# Patient Record
Sex: Female | Born: 1950
Health system: Southern US, Community
[De-identification: ages and names within clinical notes are randomized; demographics above are authoritative.]

## PROBLEM LIST (undated history)

## (undated) DIAGNOSIS — N35919 Unspecified urethral stricture, male, unspecified site: Secondary | ICD-10-CM

## (undated) DIAGNOSIS — E785 Hyperlipidemia, unspecified: Secondary | ICD-10-CM

## (undated) DIAGNOSIS — N3941 Urge incontinence: Secondary | ICD-10-CM

## (undated) DIAGNOSIS — IMO0002 Reserved for concepts with insufficient information to code with codable children: Secondary | ICD-10-CM

## (undated) DIAGNOSIS — I10 Essential (primary) hypertension: Secondary | ICD-10-CM

## (undated) DIAGNOSIS — H409 Unspecified glaucoma: Secondary | ICD-10-CM

## (undated) DIAGNOSIS — J45909 Unspecified asthma, uncomplicated: Secondary | ICD-10-CM

## (undated) DIAGNOSIS — N390 Urinary tract infection, site not specified: Secondary | ICD-10-CM

## (undated) DIAGNOSIS — R339 Retention of urine, unspecified: Secondary | ICD-10-CM

## (undated) HISTORY — DX: Unspecified asthma, uncomplicated: J45.909

## (undated) HISTORY — PX: CATARACT EXTRACTION, BILATERAL: SHX1313

## (undated) HISTORY — PX: TONSILLECTOMY: SUR1361

## (undated) HISTORY — DX: Urge incontinence: N39.41

## (undated) HISTORY — PX: GANGLION CYST EXCISION: SHX1691

## (undated) HISTORY — DX: Hyperlipidemia, unspecified: E78.5

## (undated) HISTORY — DX: Urinary tract infection, site not specified: N39.0

## (undated) HISTORY — PX: KNEE SURGERY: SHX244

## (undated) HISTORY — DX: Reserved for concepts with insufficient information to code with codable children: IMO0002

## (undated) HISTORY — DX: Unspecified urethral stricture, male, unspecified site: N35.919

## (undated) HISTORY — DX: Unspecified glaucoma: H40.9

## (undated) HISTORY — PX: NASAL SINUS SURGERY: SHX719

## (undated) HISTORY — PX: ABDOMINAL HYSTERECTOMY: SUR658

## (undated) HISTORY — DX: Retention of urine, unspecified: R33.9

## (undated) HISTORY — DX: Essential (primary) hypertension: I10

---

## 2005-09-27 ENCOUNTER — Ambulatory Visit: Payer: Self-pay

## 2007-12-29 ENCOUNTER — Ambulatory Visit: Payer: Self-pay | Admitting: Urology

## 2011-04-27 DIAGNOSIS — M545 Low back pain: Secondary | ICD-10-CM | POA: Insufficient documentation

## 2011-04-27 DIAGNOSIS — R32 Unspecified urinary incontinence: Secondary | ICD-10-CM | POA: Insufficient documentation

## 2011-10-12 ENCOUNTER — Ambulatory Visit: Payer: Self-pay | Admitting: Otolaryngology

## 2011-10-27 ENCOUNTER — Ambulatory Visit: Payer: Self-pay | Admitting: Otolaryngology

## 2011-11-11 ENCOUNTER — Ambulatory Visit: Payer: Self-pay | Admitting: Otolaryngology

## 2012-01-26 DIAGNOSIS — I10 Essential (primary) hypertension: Secondary | ICD-10-CM | POA: Insufficient documentation

## 2012-02-01 DIAGNOSIS — R7303 Prediabetes: Secondary | ICD-10-CM | POA: Insufficient documentation

## 2012-02-10 DIAGNOSIS — M549 Dorsalgia, unspecified: Secondary | ICD-10-CM | POA: Insufficient documentation

## 2012-02-10 DIAGNOSIS — R079 Chest pain, unspecified: Secondary | ICD-10-CM | POA: Insufficient documentation

## 2012-04-18 DIAGNOSIS — R05 Cough: Secondary | ICD-10-CM | POA: Insufficient documentation

## 2012-04-18 DIAGNOSIS — J310 Chronic rhinitis: Secondary | ICD-10-CM | POA: Insufficient documentation

## 2012-08-10 DIAGNOSIS — J45991 Cough variant asthma: Secondary | ICD-10-CM | POA: Insufficient documentation

## 2014-09-11 DIAGNOSIS — E785 Hyperlipidemia, unspecified: Secondary | ICD-10-CM | POA: Insufficient documentation

## 2015-01-20 ENCOUNTER — Telehealth: Payer: Self-pay

## 2015-01-20 DIAGNOSIS — N35028 Other post-traumatic urethral stricture, female: Secondary | ICD-10-CM

## 2015-01-20 NOTE — Telephone Encounter (Signed)
Pt called requesting a refill on hyophen. Pt is a Radiation protection practitioner pt. Pt has a hx of urethral stricture from a bladder injury in her twenties. Pt gets dilated every 49mo. I dont see anywhere Carollee Herter noted in [employer] of rx hyophen. Please advise.

## 2015-01-20 NOTE — Telephone Encounter (Signed)
She can have 2 months supply and discuss further with Carollee Herter at her follow up next month.    Vanna Scotland, MD

## 2015-01-21 MED ORDER — METH-HYO-M BL-BENZ ACD-PH SAL 81.6 MG PO TABS: 81.6000 mg | ORAL_TABLET | Freq: Four times a day (QID) | ORAL | Status: DC

## 2015-01-21 NOTE — Telephone Encounter (Signed)
Spoke with pt in reference to medication refill. Reinforced with pt to keep f/u appt with [facility] for further refills. Pt voiced understanding.

## 2015-02-19 ENCOUNTER — Encounter: Payer: Self-pay | Admitting: *Deleted

## 2015-03-07 ENCOUNTER — Encounter: Payer: Self-pay | Admitting: Urology

## 2015-03-07 ENCOUNTER — Ambulatory Visit (INDEPENDENT_AMBULATORY_CARE_PROVIDER_SITE_OTHER): Payer: Medicare Other | Admitting: Urology

## 2015-03-07 VITALS — BP 116/70 | HR 85 | Ht 65.0 in | Wt 144.2 lb

## 2015-03-07 DIAGNOSIS — N359 Urethral stricture, unspecified: Secondary | ICD-10-CM | POA: Diagnosis not present

## 2015-03-07 LAB — MICROSCOPIC EXAMINATION: Renal Epithel, UA: NONE SEEN /hpf

## 2015-03-07 LAB — URINALYSIS, COMPLETE
Bilirubin, UA: NEGATIVE
GLUCOSE, UA: NEGATIVE
KETONES UA: NEGATIVE
Leukocytes, UA: NEGATIVE
NITRITE UA: NEGATIVE
PROTEIN UA: NEGATIVE
RBC, UA: NEGATIVE
SPEC GRAV UA: 1.02 (ref 1.005–1.030)
UUROB: 0.2 mg/dL (ref 0.2–1.0)
pH, UA: 7 (ref 5.0–7.5)

## 2015-03-07 MED ORDER — LIDOCAINE HCL 2 % EX GEL
1.0000 "application " | Freq: Once | CUTANEOUS | Status: AC
  Administered 2015-03-07: 1 via URETHRAL

## 2015-03-07 NOTE — Progress Notes (Signed)
03/07/2015 8:06 AM   Brenda Hayden Jul 18, 1950 [id number]  Referring provider: No referring provider defined for this encounter.  Chief Complaint  Patient presents with  . Urethral stricture    Dilation    HPI: Patient is a 64 year old white female who presents today for urethral dilation.  Patient takes a daily prophylactic Macrobid, but she had stopped the prophylactic antibiotic due to her typhoid vaccination for an upcoming cruise.  She states 3-4 days after the discontinuation of the prophylactic aggravated, she started experiencing dysuria and difficulty with emptying her bladder. She then restarted the prophylactic daily Macrobid and her symptoms are starting to improve somewhat.  Today, she denies any dysuria, gross hematuria suprapubic pain. She also denies any fevers, chills, nausea or vomiting.   PNH: Past Medical History  Diagnosis Date  . HTN (hypertension)   . Glaucoma   . Urgency incontinence   . HLD (hyperlipidemia)   . Asthma   . Urethral stricture   . Incomplete bladder emptying   . Recurrent UTI   . Urethral stenosis     Surgical History: Past Surgical History  Procedure Laterality Date  . Knee surgery    . Nasal sinus surgery    . Abdominal hysterectomy      Home Medications:    Medication List       This list is accurate as of: 03/07/15 11:59 PM.  Always use your most recent med list.               albuterol 108 (90 BASE) MCG/ACT inhaler  Commonly known as:  PROVENTIL HFA;VENTOLIN HFA  Inhale 2 puffs into the lungs every 6 (six) hours as needed for wheezing or shortness of breath.     aspirin 81 MG tablet  Take 81 mg by mouth daily.     beclomethasone 40 MCG/ACT inhaler  Commonly known as:  QVAR  Inhale into the lungs 2 (two) times daily.     carvedilol 10 MG 24 hr capsule  Commonly known as:  COREG CR  Take 10 mg by mouth daily.     cyclobenzaprine 10 MG tablet  Commonly known as:  FLEXERIL  Take 10 mg by mouth 3 (three)  times daily as needed for muscle spasms.     hydrochlorothiazide 12.5 MG tablet  Commonly known as:  HYDRODIURIL  Take by mouth.     Meth-Hyo-M Bl-Benz Acd-Ph Sal 81.6 MG Tabs  Commonly known as:  HYOPHEN  Take 1 tablet (81.6 mg total) by mouth QID.     nitrofurantoin 100 MG capsule  Commonly known as:  MACRODANTIN  Take 100 mg by mouth daily.     pravastatin 40 MG tablet  Commonly known as:  PRAVACHOL  Take 40 mg by mouth daily.     SALMON OIL PO  Take by mouth.     traMADol 50 MG tablet  Commonly known as:  ULTRAM  Take by mouth every 6 (six) hours as needed.     Travoprost (BAK Free) 0.004 % Soln ophthalmic solution  Commonly known as:  TRAVATAN  1 drop at bedtime.     trimethoprim 100 MG tablet  Commonly known as:  TRIMPEX  Take 100 mg by mouth 2 (two) times daily.     vitamin E 1000 UNIT capsule  Take by mouth.     VITAMIN E BEAUTY 40981 UNIT/52ML Oil  Apply topically.        Allergies:  Allergies  Allergen Reactions  . Nubain [  Nalbuphine Hcl]   . Amlodipine Other (See Comments)    "Muscle pain and weakness"  . Atorvastatin Other (See Comments)    Felt bad, loss of appetite  . Ciprofloxacin-Ciproflox Hcl Er   . Latex   . Penicillins Nausea And Vomiting  . Singulair  [Montelukast Sodium] Other (See Comments)    Family History: Family History  Problem Relation Age of Onset  . Kidney disease Neg Hx   . Prostate cancer Neg Hx   . Bladder Cancer Neg Hx     Social History:  reports that she has never smoked. She does not have any smokeless tobacco history on file. She reports that she does not drink alcohol or use illicit drugs.  ROS: UROLOGY Frequent Urination?: Yes Hard to postpone urination?: No Burning/pain with urination?: No Get up at night to urinate?: Yes Leakage of urine?: Yes Urine stream starts and stops?: No Trouble starting stream?: No Do you have to strain to urinate?: No Blood in urine?: No Urinary tract infection?:  No Sexually transmitted disease?: No Injury to kidneys or bladder?: No Painful intercourse?: No Weak stream?: No Currently pregnant?: No Vaginal bleeding?: No Last menstrual period?: n  Gastrointestinal Nausea?: No Vomiting?: No Indigestion/heartburn?: No Diarrhea?: No Constipation?: No  Constitutional Fever: No Night sweats?: No Weight loss?: No Fatigue?: No  Skin Skin rash/lesions?: No Itching?: No  Eyes Blurred vision?: No Double vision?: No  Ears/Nose/Throat Sore throat?: No Sinus problems?: Yes  Hematologic/Lymphatic Swollen glands?: No Easy bruising?: No  Cardiovascular Leg swelling?: No Chest pain?: No  Respiratory Cough?: Yes Shortness of breath?: Yes  Endocrine Excessive thirst?: No  Musculoskeletal Back pain?: Yes Joint pain?: Yes  Neurological Headaches?: Yes Dizziness?: No  Psychologic Depression?: No Anxiety?: No  Physical Exam: BP 116/70 mmHg  Pulse 85  Ht  (1.651 m)  Wt 144 lb 3.2 oz (65.409 kg)  BMI 24.00 kg/m2  GU: Normal external genitalia.  Normal urethral meatus. No urethral masses and/or tenderness. No bladder fullness or masses. No vaginal lesions or discharge. Normal rectal tone, no masses. Normal anus and perineum.   Laboratory Data:  Urinalysis: Results for orders placed or performed in visit on 03/07/15  Microscopic Examination  Result Value Ref Range   WBC, UA 0-5 0 -  5 /hpf   RBC, UA 0-2 0 -  2 /hpf   Epithelial Cells (non renal) 0-10 0 - 10 /hpf   Renal Epithel, UA None seen None seen /hpf   Casts Present (A) None seen /lpf   Cast Type Hyaline casts N/A   Mucus, UA Present (A) Not Estab.   Bacteria, UA Few (A) None seen/Few  Urinalysis, Complete  Result Value Ref Range   Specific Gravity, UA 1.020 1.005 - 1.030   pH, UA 7.0 5.0 - 7.5   Color, UA Green (A) Yellow   Appearance Ur Clear Clear   Leukocytes, UA Negative Negative   Protein, UA Negative Negative/Trace   Glucose, UA Negative Negative    Ketones, UA Negative Negative   RBC, UA Negative Negative   Bilirubin, UA Negative Negative   Urobilinogen, Ur 0.2 0.2 - 1.0 mg/dL   Nitrite, UA Negative Negative   Microscopic Examination See below:    Procedure: Patient is placed in stirrups and her urethral meatus and vulva are cleansed with Betadine.  2% Lidocaine jelly was inserted into her urethra.  I then dilated her with Leta Jungling sounds to a 62f Walther sound without difficultly.  She tolerated the procedure well.  She will return in 4 months.   Assessment & Plan:    1. Urethral stricture, unspecified location, unspecified stricture type:   Patient tolerated the dilation.  She will continue her daily Macrobid and RTC in 4 months for her next dilation.    - Urinalysis, Complete   Return in about 4 months (around 07/07/2015) for dilation.  Michiel Cowboy, PA-C  [facility] [address] [place], [place] [address] [phone]

## 2015-03-10 DIAGNOSIS — N35919 Unspecified urethral stricture, male, unspecified site: Secondary | ICD-10-CM | POA: Insufficient documentation

## 2015-05-30 ENCOUNTER — Encounter: Payer: Self-pay | Admitting: Urology

## 2015-05-30 ENCOUNTER — Ambulatory Visit (INDEPENDENT_AMBULATORY_CARE_PROVIDER_SITE_OTHER): Payer: Medicare Other | Admitting: Urology

## 2015-05-30 VITALS — BP 118/76 | HR 78 | Ht 64.0 in | Wt 145.4 lb

## 2015-05-30 DIAGNOSIS — N359 Urethral stricture, unspecified: Secondary | ICD-10-CM

## 2015-05-30 LAB — URINALYSIS, COMPLETE
Bilirubin, UA: NEGATIVE
GLUCOSE, UA: NEGATIVE
KETONES UA: NEGATIVE
Leukocytes, UA: NEGATIVE
NITRITE UA: NEGATIVE
Protein, UA: NEGATIVE
RBC, UA: NEGATIVE
SPEC GRAV UA: 1.025 (ref 1.005–1.030)
UUROB: 0.2 mg/dL (ref 0.2–1.0)
pH, UA: 6.5 (ref 5.0–7.5)

## 2015-05-30 LAB — MICROSCOPIC EXAMINATION

## 2015-05-30 MED ORDER — NITROFURANTOIN MACROCRYSTAL 100 MG PO CAPS: 100.0000 mg | ORAL_CAPSULE | Freq: Every day | ORAL | Status: DC

## 2015-05-30 NOTE — Progress Notes (Signed)
10:47 AM   Brenda Hayden 1950/06/24 [id number]  Referring provider: Elby Beck, NP [address] [place], [place] [address]  Chief Complaint  Patient presents with  . Urethral Stricture    Dilation    HPI: Patient is a 64 year old white female who presents today for urethral dilation.  Patient was changed to trimethoprim as a daily prophylactic antibiotic at her last visit.   She had Macrobid on hand, so she took 2-3 days of that antibiotic when she felt a worsening of her symptoms. Her symptoms consist of dysuria and difficulty emptying her bladder.   She denies any recent dysuria, gross hematuria suprapubic pain. She also denies any fevers, chills, nausea or vomiting.   PNH: Past Medical History  Diagnosis Date  . HTN (hypertension)   . Glaucoma   . Urgency incontinence   . HLD (hyperlipidemia)   . Asthma   . Urethral stricture   . Incomplete bladder emptying   . Recurrent UTI   . Urethral stenosis     Surgical History: Past Surgical History  Procedure Laterality Date  . Knee surgery    . Nasal sinus surgery    . Abdominal hysterectomy      Home Medications:    Medication List       This list is accurate as of: 05/30/15 10:47 AM.  Always use your most recent med list.               albuterol 108 (90 BASE) MCG/ACT inhaler  Commonly known as:  PROVENTIL HFA;VENTOLIN HFA  Inhale 2 puffs into the lungs every 6 (six) hours as needed for wheezing or shortness of breath.     aspirin 81 MG tablet  Take 81 mg by mouth daily.     beclomethasone 40 MCG/ACT inhaler  Commonly known as:  QVAR  Inhale into the lungs 2 (two) times daily.     carvedilol 10 MG 24 hr capsule  Commonly known as:  COREG CR  Take 10 mg by mouth daily.     cyclobenzaprine 10 MG tablet  Commonly known as:  FLEXERIL  Take 10 mg by mouth 3 (three) times daily as needed for muscle spasms. Reported on 05/30/2015     hydrochlorothiazide 12.5 MG tablet    Commonly known as:  HYDRODIURIL  Take by mouth.     Meth-Hyo-M Bl-Benz Acd-Ph Sal 81.6 MG Tabs  Commonly known as:  HYOPHEN  Take 1 tablet (81.6 mg total) by mouth QID.     nitrofurantoin 100 MG capsule  Commonly known as:  MACRODANTIN  Take 1 capsule (100 mg total) by mouth daily.     pravastatin 40 MG tablet  Commonly known as:  PRAVACHOL  Take 40 mg by mouth daily.     SALMON OIL PO  Take by mouth. Reported on 05/30/2015     traMADol 50 MG tablet  Commonly known as:  ULTRAM  Take by mouth every 6 (six) hours as needed. Reported on 05/30/2015     Travoprost (BAK Free) 0.004 % Soln ophthalmic solution  Commonly known as:  TRAVATAN  1 drop at bedtime.     trimethoprim 100 MG tablet  Commonly known as:  TRIMPEX  Take 100 mg by mouth 2 (two) times daily.     vitamin E 1000 UNIT capsule  Take by mouth.     VITAMIN E BEAUTY 84696 UNIT/52ML Oil  Apply topically. Reported on 05/30/2015  Allergies:  Allergies  Allergen Reactions  . Nubain [Nalbuphine Hcl]   . Amlodipine Other (See Comments)    "Muscle pain and weakness"  . Atorvastatin Other (See Comments)    Felt bad, loss of appetite  . Ciprofloxacin-Ciproflox Hcl Er   . Latex   . Penicillins Nausea And Vomiting  . Singulair  [Montelukast Sodium] Other (See Comments)    Family History: Family History  Problem Relation Age of Onset  . Kidney disease Neg Hx   . Prostate cancer Neg Hx   . Bladder Cancer Neg Hx     Social History:  reports that she has never smoked. She does not have any smokeless tobacco history on file. She reports that she does not drink alcohol or use illicit drugs.  ROS: UROLOGY Frequent Urination?: Yes Hard to postpone urination?: No Burning/pain with urination?: Yes Get up at night to urinate?: Yes Leakage of urine?: Yes Urine stream starts and stops?: No Trouble starting stream?: No Do you have to strain to urinate?: No Blood in urine?: No Urinary tract infection?:  No Sexually transmitted disease?: No Injury to kidneys or bladder?: No Painful intercourse?: No Weak stream?: No Currently pregnant?: No Vaginal bleeding?: No Last menstrual period?: n  Gastrointestinal Nausea?: No Vomiting?: No Indigestion/heartburn?: No Diarrhea?: No Constipation?: No  Constitutional Fever: No Night sweats?: No Weight loss?: No Fatigue?: No  Skin Skin rash/lesions?: No Itching?: No  Eyes Blurred vision?: No Double vision?: No  Ears/Nose/Throat Sore throat?: Yes Sinus problems?: Yes  Hematologic/Lymphatic Swollen glands?: No Easy bruising?: No  Cardiovascular Leg swelling?: No Chest pain?: No  Respiratory Cough?: Yes Shortness of breath?: Yes  Endocrine Excessive thirst?: No  Musculoskeletal Back pain?: Yes Joint pain?: No  Neurological Headaches?: No Dizziness?: No  Psychologic Depression?: No Anxiety?: No  Physical Exam: BP 118/76 mmHg  Pulse 78  Ht  (1.626 m)  Wt 145 lb 6.4 oz (65.953 kg)  BMI 24.95 kg/m2  GU: Normal external genitalia.  Normal urethral meatus. No urethral masses and/or tenderness. No bladder fullness or masses. No vaginal lesions or discharge. Normal rectal tone, no masses. Normal anus and perineum.   Laboratory Data: Urinalysis: Results for orders placed or performed in visit on 05/30/15  Microscopic Examination  Result Value Ref Range   WBC, UA 0-5 0 -  5 /hpf   RBC, UA 0-2 0 -  2 /hpf   Epithelial Cells (non renal) 0-10 0 - 10 /hpf   Mucus, UA Present (A) Not Estab.   Bacteria, UA Few (A) None seen/Few  Urinalysis, Complete  Result Value Ref Range   Specific Gravity, UA 1.025 1.005 - 1.030   pH, UA 6.5 5.0 - 7.5   Color, UA Green (A) Yellow   Appearance Ur Clear Clear   Leukocytes, UA Negative Negative   Protein, UA Negative Negative/Trace   Glucose, UA Negative Negative   Ketones, UA Negative Negative   RBC, UA Negative Negative   Bilirubin, UA Negative Negative   Urobilinogen,  Ur 0.2 0.2 - 1.0 mg/dL   Nitrite, UA Negative Negative   Microscopic Examination See below:    Procedure: Patient is placed in lithotomy position and her urethral meatus and vulva are cleansed with Betadine.  2% Lidocaine jelly was inserted into her urethra.  I then dilated her with Leta Jungling sounds to a 70f Walther sound without difficultly.  She tolerated the procedure well.  She will return in 3 months.   Assessment & Plan:    1. Urethral  stricture, unspecified location, unspecified stricture type:   Patient tolerated the dilation.  She will continue her daily trimethoprim and RTC in 4 months for her next dilation.  Patient is warned about peripheral neuropathy and pulmonary issues associated with long-term use of Macrobid.  She states she understands these risks, but it is the only antibiotic that seems to control her symptoms.  I have refilled her trimethoprim prescription and have given her 30 capsules of the Macrobid to use sparingly.  She will return in 3 months for her next dilation.  - Urinalysis, Complete   Return in about 3 months (around 08/28/2015) for urethral dilation.  Michiel CowboySHANNON Demonica Farrey, PA-C  [facility] [address], [address] [place], [place] [id number] [phone]

## 2015-07-04 ENCOUNTER — Ambulatory Visit: Payer: Medicare Other | Admitting: Urology

## 2015-07-28 DIAGNOSIS — E119 Type 2 diabetes mellitus without complications: Secondary | ICD-10-CM | POA: Diagnosis not present

## 2015-07-28 DIAGNOSIS — R002 Palpitations: Secondary | ICD-10-CM | POA: Diagnosis not present

## 2015-07-28 DIAGNOSIS — R079 Chest pain, unspecified: Secondary | ICD-10-CM | POA: Diagnosis not present

## 2015-08-08 DIAGNOSIS — R079 Chest pain, unspecified: Secondary | ICD-10-CM | POA: Diagnosis not present

## 2015-08-08 DIAGNOSIS — I10 Essential (primary) hypertension: Secondary | ICD-10-CM | POA: Diagnosis not present

## 2015-08-08 DIAGNOSIS — Z7689 Persons encountering health services in other specified circumstances: Secondary | ICD-10-CM | POA: Diagnosis not present

## 2015-08-29 ENCOUNTER — Ambulatory Visit: Payer: Medicare Other | Admitting: Urology

## 2015-08-29 DIAGNOSIS — J310 Chronic rhinitis: Secondary | ICD-10-CM | POA: Diagnosis not present

## 2015-08-29 DIAGNOSIS — J45991 Cough variant asthma: Secondary | ICD-10-CM | POA: Diagnosis not present

## 2015-08-29 DIAGNOSIS — J45909 Unspecified asthma, uncomplicated: Secondary | ICD-10-CM | POA: Diagnosis not present

## 2015-09-03 DIAGNOSIS — E119 Type 2 diabetes mellitus without complications: Secondary | ICD-10-CM | POA: Insufficient documentation

## 2015-09-04 DIAGNOSIS — I10 Essential (primary) hypertension: Secondary | ICD-10-CM | POA: Diagnosis not present

## 2015-09-04 DIAGNOSIS — J45991 Cough variant asthma: Secondary | ICD-10-CM | POA: Diagnosis not present

## 2015-09-04 DIAGNOSIS — Z1239 Encounter for other screening for malignant neoplasm of breast: Secondary | ICD-10-CM | POA: Diagnosis not present

## 2015-09-04 DIAGNOSIS — E119 Type 2 diabetes mellitus without complications: Secondary | ICD-10-CM | POA: Diagnosis not present

## 2015-09-04 DIAGNOSIS — Z0000 Encounter for general adult medical examination without abnormal findings: Secondary | ICD-10-CM | POA: Diagnosis not present

## 2015-09-04 DIAGNOSIS — Z13820 Encounter for screening for osteoporosis: Secondary | ICD-10-CM | POA: Diagnosis not present

## 2015-09-04 DIAGNOSIS — Z23 Encounter for immunization: Secondary | ICD-10-CM | POA: Diagnosis not present

## 2015-09-17 DIAGNOSIS — Z961 Presence of intraocular lens: Secondary | ICD-10-CM | POA: Diagnosis not present

## 2015-09-17 DIAGNOSIS — H43812 Vitreous degeneration, left eye: Secondary | ICD-10-CM | POA: Diagnosis not present

## 2015-09-17 DIAGNOSIS — H401131 Primary open-angle glaucoma, bilateral, mild stage: Secondary | ICD-10-CM | POA: Diagnosis not present

## 2015-10-30 ENCOUNTER — Other Ambulatory Visit: Payer: Self-pay | Admitting: Urology

## 2015-12-23 DIAGNOSIS — Z1231 Encounter for screening mammogram for malignant neoplasm of breast: Secondary | ICD-10-CM | POA: Diagnosis not present

## 2015-12-23 DIAGNOSIS — N959 Unspecified menopausal and perimenopausal disorder: Secondary | ICD-10-CM | POA: Diagnosis not present

## 2015-12-23 DIAGNOSIS — Z13820 Encounter for screening for osteoporosis: Secondary | ICD-10-CM | POA: Diagnosis not present

## 2016-01-09 ENCOUNTER — Ambulatory Visit (INDEPENDENT_AMBULATORY_CARE_PROVIDER_SITE_OTHER): Payer: Medicare Other | Admitting: Urology

## 2016-01-09 ENCOUNTER — Encounter: Payer: Self-pay | Admitting: Urology

## 2016-01-09 VITALS — BP 139/75 | HR 81 | Ht 64.0 in | Wt 143.2 lb

## 2016-01-09 DIAGNOSIS — N35028 Other post-traumatic urethral stricture, female: Secondary | ICD-10-CM | POA: Diagnosis not present

## 2016-01-09 DIAGNOSIS — R310 Gross hematuria: Secondary | ICD-10-CM | POA: Diagnosis not present

## 2016-01-09 DIAGNOSIS — N359 Urethral stricture, unspecified: Secondary | ICD-10-CM

## 2016-01-09 LAB — URINALYSIS, COMPLETE
Bilirubin, UA: NEGATIVE
GLUCOSE, UA: NEGATIVE
Ketones, UA: NEGATIVE
LEUKOCYTES UA: NEGATIVE
NITRITE UA: NEGATIVE
PROTEIN UA: NEGATIVE
RBC, UA: NEGATIVE
Specific Gravity, UA: 1.02 (ref 1.005–1.030)
Urobilinogen, Ur: 0.2 mg/dL (ref 0.2–1.0)
pH, UA: 7 (ref 5.0–7.5)

## 2016-01-09 LAB — MICROSCOPIC EXAMINATION: Bacteria, UA: NONE SEEN

## 2016-01-09 MED ORDER — METH-HYO-M BL-BENZ ACD-PH SAL 81.6 MG PO TABS: 81.6000 mg | ORAL_TABLET | Freq: Four times a day (QID) | ORAL | 4 refills | Status: DC

## 2016-01-09 MED ORDER — TRIMETHOPRIM 100 MG PO TABS: 100.0000 mg | ORAL_TABLET | Freq: Two times a day (BID) | ORAL | 4 refills | Status: DC

## 2016-01-09 NOTE — Progress Notes (Signed)
9:26 AM   Brenda Hayden Sep 08, 1950 [id number]  Referring provider: Elby Beck, NP [address] [place], [place] [address]  Chief Complaint  Patient presents with  . Follow-up    urethral Dilation    HPI: Patient is a 65 year old Caucasian female who presents today for urethral dilation.    Her complaints today consist of frequency and incontinence.  It has been 6 months since her last dilation.  She did have an episode of gross hematuria.     She does not have a prior history of recurrent urinary tract infections, nephrolithiasis, trauma to the genitourinary tract or malignancies of the genitourinary tract.   She does not have a family medical history of nephrolithiasis, malignancies of the genitourinary tract or hematuria.   Today, she is  having symptoms of frequent urination and incontinence. She is not having symptoms of urgency, dysuria, nocturia, hesitancy, intermittency, straining to urinate or a weak urinary stream.  Her UA today demonstrates no hematuria.  She not experiencing any suprapubic pain, abdominal pain or flank pain. She denies any recent fevers, chills, nausea or vomiting.   She is not a smoker.   She is not exposed to secondhand smoke.  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    PNH: Past Medical History:  Diagnosis Date  . Asthma   . Glaucoma   . HLD (hyperlipidemia)   . HTN (hypertension)   . Incomplete bladder emptying   . Recurrent UTI   . Urethral stenosis   . Urethral stricture   . Urgency incontinence     Surgical History: Past Surgical History:  Procedure Laterality Date  . ABDOMINAL HYSTERECTOMY    . GANGLION CYST EXCISION    . KNEE SURGERY    . NASAL SINUS SURGERY    . TONSILLECTOMY      Home Medications:    Medication List       Accurate as of 01/09/16  9:26 AM. Always use your most recent med list.          albuterol 108 (90 Base) MCG/ACT inhaler Commonly known as:  PROVENTIL  HFA;VENTOLIN HFA Inhale 2 puffs into the lungs every 6 (six) hours as needed for wheezing or shortness of breath.   aspirin 81 MG tablet Take 81 mg by mouth daily.   beclomethasone 40 MCG/ACT inhaler Commonly known as:  QVAR Inhale into the lungs 2 (two) times daily.   carvedilol 10 MG 24 hr capsule Commonly known as:  COREG CR Take 10 mg by mouth daily.   cyclobenzaprine 10 MG tablet Commonly known as:  FLEXERIL Take 10 mg by mouth 3 (three) times daily as needed for muscle spasms. Reported on 05/30/2015   hydrochlorothiazide 12.5 MG tablet Commonly known as:  HYDRODIURIL Take by mouth.   hydrochlorothiazide 12.5 MG tablet Commonly known as:  HYDRODIURIL Take by mouth.   Meth-Hyo-M Bl-Benz Acd-Ph Sal 81.6 MG Tabs Commonly known as:  HYOPHEN Take 1 tablet (81.6 mg total) by mouth QID.   mometasone 50 MCG/ACT nasal spray Commonly known as:  NASONEX   nitrofurantoin 100 MG capsule Commonly known as:  MACRODANTIN Take 1 capsule (100 mg total) by mouth daily.   pravastatin 40 MG tablet Commonly known as:  PRAVACHOL Take 40 mg by mouth daily.   SALMON OIL PO Take by mouth. Reported on 05/30/2015   SALMON OIL-1000 PO Take by mouth.   traMADol 50 MG tablet Commonly known as:  ULTRAM Take by  mouth every 6 (six) hours as needed. Reported on 05/30/2015   travoprost (benzalkonium) 0.004 % ophthalmic solution Commonly known as:  TRAVATAN Apply to eye.   trimethoprim 100 MG tablet Commonly known as:  TRIMPEX Take 1 tablet (100 mg total) by mouth 2 (two) times daily.   vitamin E 1000 UNIT capsule Take by mouth.   VITAMIN E BEAUTY 16109 UNIT/52ML Oil Apply topically. Reported on 05/30/2015       Allergies:  Allergies  Allergen Reactions  . Nubain [Nalbuphine Hcl]   . Amlodipine Other (See Comments)    "Muscle pain and weakness"  . Atorvastatin Other (See Comments)    Felt bad, loss of appetite  . Ciprofloxacin-Ciproflox Hcl Er   . Latex   . Nalbuphine  Other (See Comments)  . Penicillins Nausea And Vomiting  . Singulair  [Montelukast Sodium] Other (See Comments)    Family History: Family History  Problem Relation Age of Onset  . Kidney disease Neg Hx   . Prostate cancer Neg Hx   . Bladder Cancer Neg Hx     Social History:  reports that she has never smoked. She has never used smokeless tobacco. She reports that she does not drink alcohol or use drugs.  ROS: UROLOGY Frequent Urination?: Yes Hard to postpone urination?: No Burning/pain with urination?: No Get up at night to urinate?: No Leakage of urine?: Yes Urine stream starts and stops?: No Trouble starting stream?: No Do you have to strain to urinate?: No Blood in urine?: No Urinary tract infection?: No Sexually transmitted disease?: No Injury to kidneys or bladder?: No Painful intercourse?: No Weak stream?: No Currently pregnant?: No Vaginal bleeding?: Yes Last menstrual period?: n  Gastrointestinal Nausea?: No Vomiting?: No Indigestion/heartburn?: No Diarrhea?: No Constipation?: No  Constitutional Fever: No Night sweats?: Yes Weight loss?: No Fatigue?: No  Skin Skin rash/lesions?: No Itching?: No  Eyes Blurred vision?: No Double vision?: No  Ears/Nose/Throat Sore throat?: No Sinus problems?: Yes  Hematologic/Lymphatic Swollen glands?: No Easy bruising?: Yes  Cardiovascular Leg swelling?: No Chest pain?: No  Respiratory Cough?: Yes Shortness of breath?: Yes  Endocrine Excessive thirst?: No  Musculoskeletal Back pain?: Yes Joint pain?: No  Neurological Headaches?: No Dizziness?: No  Psychologic Depression?: No Anxiety?: No  Physical Exam: BP 139/75   Pulse 81   Ht 5\' 4"  (1.626 m)   Wt 143 lb 3.2 oz (65 kg)   BMI 24.58 kg/m   GU: Atrophic external genitalia.  Normal urethral meatus. No urethral masses and/or tenderness. No bladder fullness or masses. No vaginal lesions or discharge. Normal rectal tone, no masses. Normal  anus and perineum.   Laboratory Data: Urinalysis: Not significant for hematuria.  See EPIC.  Procedure: Patient is placed in lithotomy position and her urethral meatus and vulva are cleansed with Betadine.  2% Lidocaine jelly was inserted into her urethra.  I then dilated her with Leta Jungling sounds to a 34f Walther sound without difficultly.  She tolerated the procedure well.     Assessment & Plan:    1. Urethral stricture:   Patient tolerated the dilation.  She will continue her daily trimethoprim and Hyophen prn.     2. Gross hematuria:   I explained to the patient that there are a number of causes that can be associated with blood in the urine, such as stones,  UTI's, damage to the urinary tract and/or cancer.  At this time, I felt that the patient warranted further urologic evaluation.   The AUA guidelines  state that a CT urogram is the preferred imaging study to evaluate hematuria.  I explained to the patient that a contrast material will be injected into a vein and that in rare instances, an allergic reaction can result and may even life threatening   The patient denies any allergies to contrast, iodine and/or seafood and is not taking metformin.  Her reproductive status is status post hysterectomy.    Following the imaging study,  I've recommended a cystoscopy. I described how this is performed, typically in an office setting with a flexible cystoscope. We described the risks, benefits, and possible side effects, the most common of which is a minor amount of blood in the urine and/or burning which usually resolves in 24 to 48 hours.    The patient had the opportunity to ask questions which were answered. Based upon this discussion, the patient is willing to proceed. Therefore, I've ordered: a CT Urogram and cystoscopy.  She will return following all of the above for discussion of the results.     - Urinalysis, Complete - BUN + creatinine   Return for CT report and cystoscopy for  hematuria.  Michiel Cowboy, PA-C  [facility] [address], [address] [place], [place] [address] [phone]

## 2016-01-10 LAB — BUN+CREAT
BUN / CREAT RATIO: 21 (ref 12–28)
BUN: 20 mg/dL (ref 8–27)
CREATININE: 0.95 mg/dL (ref 0.57–1.00)
GFR, EST AFRICAN AMERICAN: 72 mL/min/{1.73_m2} (ref >59)
GFR, EST NON AFRICAN AMERICAN: 63 mL/min/{1.73_m2} (ref >59)

## 2016-01-21 DIAGNOSIS — H43812 Vitreous degeneration, left eye: Secondary | ICD-10-CM | POA: Diagnosis not present

## 2016-01-21 DIAGNOSIS — H527 Unspecified disorder of refraction: Secondary | ICD-10-CM | POA: Diagnosis not present

## 2016-01-21 DIAGNOSIS — H401131 Primary open-angle glaucoma, bilateral, mild stage: Secondary | ICD-10-CM | POA: Diagnosis not present

## 2016-01-21 DIAGNOSIS — H26492 Other secondary cataract, left eye: Secondary | ICD-10-CM | POA: Diagnosis not present

## 2016-01-21 DIAGNOSIS — Z961 Presence of intraocular lens: Secondary | ICD-10-CM | POA: Diagnosis not present

## 2016-01-30 ENCOUNTER — Ambulatory Visit: Admission: RE | Admit: 2016-01-30 | Payer: Medicare Other | Source: Ambulatory Visit

## 2016-02-04 ENCOUNTER — Ambulatory Visit
Admission: RE | Admit: 2016-02-04 | Discharge: 2016-02-04 | Disposition: A | Discharge status: Home / Self Care | Payer: Medicare Other | Source: Ambulatory Visit | Attending: Urology | Admitting: Urology

## 2016-02-04 DIAGNOSIS — R310 Gross hematuria: Secondary | ICD-10-CM | POA: Diagnosis not present

## 2016-02-04 DIAGNOSIS — N200 Calculus of kidney: Secondary | ICD-10-CM | POA: Diagnosis not present

## 2016-02-04 DIAGNOSIS — Z90710 Acquired absence of both cervix and uterus: Secondary | ICD-10-CM | POA: Insufficient documentation

## 2016-02-04 MED ORDER — IOPAMIDOL (ISOVUE-300) INJECTION 61%
125.0000 mL | Freq: Once | INTRAVENOUS | Status: AC | PRN
  Administered 2016-02-04: 125 mL via INTRAVENOUS

## 2016-02-06 ENCOUNTER — Encounter: Payer: Self-pay | Admitting: Urology

## 2016-02-06 ENCOUNTER — Ambulatory Visit (INDEPENDENT_AMBULATORY_CARE_PROVIDER_SITE_OTHER): Payer: Medicare Other | Admitting: Urology

## 2016-02-06 VITALS — BP 133/85 | HR 86 | Ht 64.0 in | Wt 141.0 lb

## 2016-02-06 DIAGNOSIS — J45909 Unspecified asthma, uncomplicated: Secondary | ICD-10-CM | POA: Insufficient documentation

## 2016-02-06 DIAGNOSIS — R310 Gross hematuria: Secondary | ICD-10-CM | POA: Diagnosis not present

## 2016-02-06 LAB — URINALYSIS, COMPLETE
Bilirubin, UA: NEGATIVE
GLUCOSE, UA: NEGATIVE
KETONES UA: NEGATIVE
LEUKOCYTES UA: NEGATIVE
Nitrite, UA: NEGATIVE
SPEC GRAV UA: 1.02 (ref 1.005–1.030)
Urobilinogen, Ur: 0.2 mg/dL (ref 0.2–1.0)
pH, UA: 6 (ref 5.0–7.5)

## 2016-02-06 LAB — MICROSCOPIC EXAMINATION

## 2016-02-06 MED ORDER — LIDOCAINE HCL 2 % EX GEL
1.0000 "application " | Freq: Once | CUTANEOUS | Status: AC
  Administered 2016-02-06: 1 via URETHRAL

## 2016-02-06 MED ORDER — SULFAMETHOXAZOLE-TRIMETHOPRIM 800-160 MG PO TABS
1.0000 | ORAL_TABLET | Freq: Once | ORAL | Status: AC
  Administered 2016-02-06: 1 via ORAL

## 2016-02-06 NOTE — Progress Notes (Signed)
02/06/2016 9:38 AM   Brenda Hayden C Durell 10/26/1950 [id number]  Referring provider: Elby Beckaniel D Crummett, NP [address] [place], [place] [address]  Chief Complaint  Patient presents with  . Cysto    CTscan results    HPI: Brenda Hayden The patient last assessed recommended that the patient has urethral dilations but she had an episode of gross hematuria. She does have a history of frequency and incontinence. She is a nonsmoker. She tolerated her last dilation very well and was on daily trimethoprim. He is here for cystoscopy and follow-up CAT scan.  Today the patient noted she was voiding better post dilation several weeks ago. She saw blood once it is not for certain it was in the urine.  Frequency is stable  Today she underwent cystoscopy utilizing sterile technique after consent given. The bladder mucosa and trigone were normal. There was no stitch or foreign body or carcinoma. She was a bit nervous but she tolerated procedure very well. She had some hyperemic blood vessels in the urethra within normal limits  He had a very small stone in the lower pole of the right kidney but otherwise CT scan was normal well supported bladder neck     PMH: Past Medical History:  Diagnosis Date  . Asthma   . Glaucoma   . HLD (hyperlipidemia)   . HTN (hypertension)   . Incomplete bladder emptying   . Recurrent UTI   . Urethral stenosis   . Urethral stricture   . Urgency incontinence     Surgical History: Past Surgical History:  Procedure Laterality Date  . ABDOMINAL HYSTERECTOMY    . GANGLION CYST EXCISION    . KNEE SURGERY    . NASAL SINUS SURGERY    . TONSILLECTOMY      Home Medications:    Medication List       Accurate as of 02/06/16  9:38 AM. Always use your most recent med list.          aspirin 81 MG tablet Take 81 mg by mouth daily.   beclomethasone 40 MCG/ACT inhaler Commonly known as:  QVAR Inhale into the lungs 2 (two) times daily.     hydrochlorothiazide 12.5 MG tablet Commonly known as:  HYDRODIURIL Take by mouth.   Meth-Hyo-M Bl-Benz Acd-Ph Sal 81.6 MG Tabs Commonly known as:  HYOPHEN Take 1 tablet (81.6 mg total) by mouth QID.   pravastatin 40 MG tablet Commonly known as:  PRAVACHOL Take 40 mg by mouth daily.   SALMON OIL PO Take by mouth. Reported on 05/30/2015   travoprost (benzalkonium) 0.004 % ophthalmic solution Commonly known as:  TRAVATAN Apply to eye.   trimethoprim 100 MG tablet Commonly known as:  TRIMPEX Take 1 tablet (100 mg total) by mouth 2 (two) times daily.   vitamin E 1000 UNIT capsule Take by mouth.       Allergies:  Allergies  Allergen Reactions  . Nubain [Nalbuphine Hcl]   . Amlodipine Other (See Comments)    "Muscle pain and weakness"  . Atorvastatin Other (See Comments)    Felt bad, loss of appetite  . Ciprofloxacin-Ciproflox Hcl Er   . Latex   . Nalbuphine Other (See Comments)  . Penicillins Nausea And Vomiting  . Singulair  [Montelukast Sodium] Other (See Comments)    Family History: Family History  Problem Relation Age of Onset  . Kidney disease Neg Hx   . Prostate cancer Neg Hx   . Bladder Cancer Neg Hx  Social History:  reports that she has never smoked. She has never used smokeless tobacco. She reports that she does not drink alcohol or use drugs.  ROS:                                        Physical Exam: BP 133/85   Pulse 86   Ht 5\' 4"  (1.626 m)   Wt 141 lb (64 kg)   BMI 24.20 kg/m   Constitutional:  Alert and oriented, No acute distress. HEENT: [place] AT, moist mucus membranes.  Trachea midline, no masses. Cardiovascular: No clubbing, cyanosis, or edema. Respiratory: Normal respiratory effort, no increased work of breathing. GI: Abdomen is soft, nontender, nondistended, no abdominal masses GU: No CVA tenderness. Well supported bladder neck Skin: No rashes, bruises or suspicious lesions. Lymph: No cervical or inguinal  adenopathy. Neurologic: Grossly intact, no focal deficits, moving all 4 extremities. Psychiatric: Normal mood and affect.  Laboratory Data: No results found for: WBC, HGB, HCT, MCV, PLT  Lab Results  Component Value Date   CREATININE 0.95 01/09/2016    No results found for: PSA  No results found for: TESTOSTERONE  No results found for: HGBA1C  Urinalysis    Component Value Date/Time   APPEARANCEUR Clear 01/09/2016 0858   GLUCOSEU Negative 01/09/2016 0858   BILIRUBINUR Negative 01/09/2016 0858   PROTEINUR Negative 01/09/2016 0858   NITRITE Negative 01/09/2016 0858   LEUKOCYTESUR Negative 01/09/2016 0858    Pertinent Imaging: Dictated above  Assessment & Plan:  The patient has been cleared for hematuria. She will return as scheduled for dilation  1. Gross hematuria 2. Urethral stricture   - Urinalysis, Complete - lidocaine (XYLOCAINE) 2 % jelly 1 application; Place 1 application into the urethra once. - sulfamethoxazole-trimethoprim (BACTRIM DS,SEPTRA DS) 800-160 MG per tablet 1 tablet; Take 1 tablet by mouth once.   No Follow-up on file.  Martina Sinner, MD  [facility] [address], [address] [place], [place] [address] [phone]

## 2016-03-08 DIAGNOSIS — Z23 Encounter for immunization: Secondary | ICD-10-CM | POA: Diagnosis not present

## 2016-03-29 ENCOUNTER — Telehealth: Payer: Self-pay | Admitting: Urology

## 2016-03-29 DIAGNOSIS — N35028 Other post-traumatic urethral stricture, female: Secondary | ICD-10-CM

## 2016-03-29 MED ORDER — METH-HYO-M BL-BENZ ACD-PH SAL 81.6 MG PO TABS: 81.6000 mg | ORAL_TABLET | Freq: Four times a day (QID) | ORAL | 4 refills | Status: DC

## 2016-03-29 NOTE — Telephone Encounter (Signed)
Refills given.

## 2016-03-29 NOTE — Telephone Encounter (Signed)
Pt called and needs refills to [facility] Hyophen tabs.  She would like a 3 month supply.  Directions s/b Take 1 tablet 4 times per day.  [phone]

## 2016-04-27 DIAGNOSIS — E785 Hyperlipidemia, unspecified: Secondary | ICD-10-CM | POA: Diagnosis not present

## 2016-04-27 DIAGNOSIS — R079 Chest pain, unspecified: Secondary | ICD-10-CM | POA: Diagnosis not present

## 2016-04-27 DIAGNOSIS — I10 Essential (primary) hypertension: Secondary | ICD-10-CM | POA: Diagnosis not present

## 2016-04-27 DIAGNOSIS — E119 Type 2 diabetes mellitus without complications: Secondary | ICD-10-CM | POA: Diagnosis not present

## 2016-04-29 DIAGNOSIS — Z1231 Encounter for screening mammogram for malignant neoplasm of breast: Secondary | ICD-10-CM | POA: Diagnosis not present

## 2016-04-29 DIAGNOSIS — Z13820 Encounter for screening for osteoporosis: Secondary | ICD-10-CM | POA: Diagnosis not present

## 2016-05-13 ENCOUNTER — Other Ambulatory Visit: Payer: Self-pay | Admitting: *Deleted

## 2016-05-13 DIAGNOSIS — N359 Urethral stricture, unspecified: Secondary | ICD-10-CM

## 2016-05-14 ENCOUNTER — Other Ambulatory Visit
Admission: RE | Admit: 2016-05-14 | Discharge: 2016-05-14 | Disposition: A | Discharge status: Home / Self Care | Payer: Medicare Other | Source: Ambulatory Visit | Attending: Urology | Admitting: Urology

## 2016-05-14 ENCOUNTER — Ambulatory Visit (INDEPENDENT_AMBULATORY_CARE_PROVIDER_SITE_OTHER): Payer: Medicare Other | Admitting: Urology

## 2016-05-14 ENCOUNTER — Encounter: Payer: Self-pay | Admitting: Urology

## 2016-05-14 VITALS — BP 150/102 | HR 85 | Ht 64.0 in | Wt 147.0 lb

## 2016-05-14 DIAGNOSIS — N359 Urethral stricture, unspecified: Secondary | ICD-10-CM

## 2016-05-14 DIAGNOSIS — R310 Gross hematuria: Secondary | ICD-10-CM

## 2016-05-14 LAB — URINALYSIS COMPLETE WITH MICROSCOPIC (ARMC ONLY)
BACTERIA UA: NONE SEEN
RBC / HPF: NONE SEEN RBC/hpf (ref 0–5)

## 2016-05-14 MED ORDER — METH-HYO-M BL-BENZ ACD-PH SAL 81.6 MG PO TABS: 81.6000 mg | ORAL_TABLET | Freq: Four times a day (QID) | ORAL | 4 refills | Status: DC

## 2016-05-14 NOTE — Progress Notes (Signed)
9:47 AM   Brenda Hayden 09/14/1950 [id number]  Referring provider: Elby Beckaniel D Crummett, NP [address] [place], [place] [address]  Chief Complaint  Patient presents with  . Follow-up    Urethral Stricture patient here for dilation    HPI: Patient is a 65 year old Caucasian female who presents today for urethral dilation.    Her complaints today consist of frequency and incontinence.  It has been 6 months since her last dilation.   She does not have a prior history of recurrent urinary tract infections, nephrolithiasis, trauma to the genitourinary tract or malignancies of the genitourinary tract.   She does not have a family medical history of nephrolithiasis, malignancies of the genitourinary tract or hematuria.   She not experiencing any suprapubic pain, abdominal pain or flank pain. She denies any recent fevers, chills, nausea or vomiting.   She is not a smoker.   She is not exposed to secondhand smoke.  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   She completed a workup for gross hematuria in August 2017 with CT urogram and cystoscopy. No worrisome GU findings were discovered.    PNH: Past Medical History:  Diagnosis Date  . Asthma   . Glaucoma   . HLD (hyperlipidemia)   . HTN (hypertension)   . Incomplete bladder emptying   . Recurrent UTI   . Urethral stenosis   . Urethral stricture   . Urgency incontinence     Surgical History: Past Surgical History:  Procedure Laterality Date  . ABDOMINAL HYSTERECTOMY    . CATARACT EXTRACTION, BILATERAL    . GANGLION CYST EXCISION    . KNEE SURGERY    . NASAL SINUS SURGERY    . TONSILLECTOMY      Home Medications:    Medication List       Accurate as of 05/14/16  9:47 AM. Always use your most recent med list.          aspirin 81 MG tablet Take 81 mg by mouth daily.   beclomethasone 40 MCG/ACT inhaler Commonly known as:  QVAR Inhale into the lungs 2 (two) times daily.     hydrochlorothiazide 12.5 MG capsule Commonly known as:  MICROZIDE Take 12.5 mg by mouth daily.   hydrochlorothiazide 12.5 MG tablet Commonly known as:  HYDRODIURIL Take by mouth.   Meth-Hyo-M Bl-Benz Acd-Ph Sal 81.6 MG Tabs Commonly known as:  HYOPHEN Take 1 tablet (81.6 mg total) by mouth QID.   pravastatin 40 MG tablet Commonly known as:  PRAVACHOL Take 40 mg by mouth daily.   SALMON OIL PO Take by mouth. Reported on 05/30/2015   travoprost (benzalkonium) 0.004 % ophthalmic solution Commonly known as:  TRAVATAN Apply to eye.   trimethoprim 100 MG tablet Commonly known as:  TRIMPEX Take 1 tablet (100 mg total) by mouth 2 (two) times daily.   vitamin E 1000 UNIT capsule Take by mouth.       Allergies:  Allergies  Allergen Reactions  . Nubain [Nalbuphine Hcl]   . Amlodipine Other (See Comments)    "Muscle pain and weakness"  . Atorvastatin Other (See Comments)    Felt bad, loss of appetite  . Ciprofloxacin-Ciproflox Hcl Er   . Latex   . Nalbuphine Other (See Comments)  . Penicillins Nausea And Vomiting  . Singulair  [Montelukast Sodium] Other (See Comments)    Family History: Family History  Problem Relation Age of Onset  . Colon cancer Mother   .  Diabetes Father   . Kidney disease Neg Hx   . Prostate cancer Neg Hx   . Bladder Cancer Neg Hx     Social History:  reports that she has never smoked. She has never used smokeless tobacco. She reports that she does not drink alcohol or use drugs.  ROS: UROLOGY Frequent Urination?: No Hard to postpone urination?: Yes Burning/pain with urination?: No Get up at night to urinate?: No Leakage of urine?: Yes Urine stream starts and stops?: No Trouble starting stream?: No Do you have to strain to urinate?: No Blood in urine?: No Urinary tract infection?: No Sexually transmitted disease?: No Injury to kidneys or bladder?: No Painful intercourse?: No Weak stream?: No Currently pregnant?: No Vaginal  bleeding?: No Last menstrual period?: n  Gastrointestinal Nausea?: No Vomiting?: No Indigestion/heartburn?: No Diarrhea?: No Constipation?: No  Constitutional Fever: No Night sweats?: No Weight loss?: No Fatigue?: No  Skin Skin rash/lesions?: No Itching?: No  Eyes Blurred vision?: No Double vision?: No  Ears/Nose/Throat Sore throat?: No Sinus problems?: Yes  Hematologic/Lymphatic Swollen glands?: No Easy bruising?: No  Cardiovascular Leg swelling?: No Chest pain?: No  Respiratory Cough?: Yes Shortness of breath?: Yes  Endocrine Excessive thirst?: No  Musculoskeletal Back pain?: Yes Joint pain?: Yes  Neurological Headaches?: No Dizziness?: No  Psychologic Depression?: No Anxiety?: No  Physical Exam: BP (!) 150/102   Pulse 85   Ht 5\' 4"  (1.626 m)   Wt 147 lb (66.7 kg)   BMI 25.23 kg/m   GU: Atrophic external genitalia.  Normal urethral meatus. No urethral masses and/or tenderness. No bladder fullness or masses. No vaginal lesions or discharge. Normal rectal tone, no masses. Normal anus and perineum.   Laboratory Data: Urinalysis: Not significant for hematuria.  See EPIC.  Procedure: Patient is placed in lithotomy position and her urethral meatus and vulva are cleansed with Betadine.  2% Lidocaine jelly was inserted into her urethra.  I then dilated her with Leta JunglingWalther sounds to a 3353f Walther sound without difficultly.  She tolerated the procedure well.     Assessment & Plan:    1. Urethral stricture:   Patient tolerated the dilation.  She will continue her daily trimethoprim and Hyophen prn.     2. Gross hematuria  - completed hematuria work up in 01/2016- no worrisome GU findings  Return in about 4 months (around 09/12/2016) for urethral dilation.  Michiel CowboySHANNON Calle Schader, PA-C  [facility] [address], [address] [place], [place] [address] [phone]

## 2016-05-19 DIAGNOSIS — H401131 Primary open-angle glaucoma, bilateral, mild stage: Secondary | ICD-10-CM | POA: Diagnosis not present

## 2016-05-19 DIAGNOSIS — Z961 Presence of intraocular lens: Secondary | ICD-10-CM | POA: Diagnosis not present

## 2016-05-19 DIAGNOSIS — H43812 Vitreous degeneration, left eye: Secondary | ICD-10-CM | POA: Diagnosis not present

## 2016-05-19 DIAGNOSIS — H26492 Other secondary cataract, left eye: Secondary | ICD-10-CM | POA: Diagnosis not present

## 2016-05-27 DIAGNOSIS — L570 Actinic keratosis: Secondary | ICD-10-CM | POA: Diagnosis not present

## 2016-08-05 ENCOUNTER — Telehealth: Payer: Self-pay

## 2016-08-05 NOTE — Telephone Encounter (Signed)
PA for hyophen was DENIED!!!

## 2016-09-14 NOTE — Progress Notes (Signed)
8:53 AM   Brenda Hayden 02/03/1951 [id number]  Referring provider: Elby Beck, NP [address] [place], [place] [address]  Chief Complaint  Patient presents with  . Follow-up    uretheral dialation    HPI: Patient is a 66 year old Caucasian female with a history of hematuria and urethral stricture who presents today for urethral dilation.    Her complaints today consist of frequency, urgency, dysuria, nocturia and incontinence..  It has been 4 months since her last dilation.   She does not have a prior history of recurrent urinary tract infections, nephrolithiasis, trauma to the genitourinary tract or malignancies of the genitourinary tract.   She does not have a family medical history of nephrolithiasis, malignancies of the genitourinary tract or hematuria.   She not experiencing any suprapubic pain, abdominal pain or flank pain. She denies any recent fevers, chills, nausea or vomiting.   She is not a smoker.   She is not exposed to secondhand smoke.  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   She completed a workup for gross hematuria in August 2017 with CT urogram and cystoscopy. No worrisome GU findings were discovered.  Her UA demonstrates 0-5 RBC's and 0-5 WBC's.       PNH: Past Medical History:  Diagnosis Date  . Asthma   . Glaucoma   . HLD (hyperlipidemia)   . HTN (hypertension)   . Incomplete bladder emptying   . Recurrent UTI   . Urethral stenosis   . Urethral stricture   . Urgency incontinence     Surgical History: Past Surgical History:  Procedure Laterality Date  . ABDOMINAL HYSTERECTOMY    . CATARACT EXTRACTION, BILATERAL    . GANGLION CYST EXCISION    . KNEE SURGERY    . NASAL SINUS SURGERY    . TONSILLECTOMY      Home Medications:  Allergies as of 09/17/2016      Reactions   Nubain [nalbuphine Hcl]    Amlodipine Other (See Comments)   "Muscle pain and weakness"   Atorvastatin Other (See Comments)   Felt  bad, loss of appetite   Ciprofloxacin-ciproflox Hcl Er    Latex    Nalbuphine Other (See Comments)   Penicillins Nausea And Vomiting   Singulair  [montelukast Sodium] Other (See Comments)      Medication List       Accurate as of 09/17/16  8:53 AM. Always use your most recent med list.          aspirin 81 MG tablet Take 81 mg by mouth daily.   beclomethasone 40 MCG/ACT inhaler Commonly known as:  QVAR Inhale into the lungs 2 (two) times daily.   hydrochlorothiazide 12.5 MG capsule Commonly known as:  MICROZIDE Take 12.5 mg by mouth daily.   hydrochlorothiazide 12.5 MG tablet Commonly known as:  HYDRODIURIL Take by mouth.   Meth-Hyo-M Bl-Benz Acd-Ph Sal 81.6 MG Tabs Commonly known as:  HYOPHEN Take 1 tablet (81.6 mg total) by mouth QID.   pravastatin 40 MG tablet Commonly known as:  PRAVACHOL Take 40 mg by mouth daily.   SALMON OIL PO Take by mouth. Reported on 05/30/2015   travoprost (benzalkonium) 0.004 % ophthalmic solution Commonly known as:  TRAVATAN Apply to eye.   trimethoprim 100 MG tablet Commonly known as:  TRIMPEX Take 1 tablet (100 mg total) by mouth 2 (two) times daily.   vitamin E 1000 UNIT capsule Take by mouth.  Allergies:  Allergies  Allergen Reactions  . Nubain [Nalbuphine Hcl]   . Amlodipine Other (See Comments)    "Muscle pain and weakness"  . Atorvastatin Other (See Comments)    Felt bad, loss of appetite  . Ciprofloxacin-Ciproflox Hcl Er   . Latex   . Nalbuphine Other (See Comments)  . Penicillins Nausea And Vomiting  . Singulair  [Montelukast Sodium] Other (See Comments)    Family History: Family History  Problem Relation Age of Onset  . Colon cancer Mother   . Diabetes Father   . Kidney disease Neg Hx   . Prostate cancer Neg Hx   . Bladder Cancer Neg Hx     Social History:  reports that she has never smoked. She has never used smokeless tobacco. She reports that she does not drink alcohol or use  drugs.  ROS: UROLOGY Frequent Urination?: Yes Hard to postpone urination?: Yes Burning/pain with urination?: Yes Get up at night to urinate?: Yes Leakage of urine?: Yes Urine stream starts and stops?: No Trouble starting stream?: No Do you have to strain to urinate?: No Blood in urine?: No Urinary tract infection?: No Sexually transmitted disease?: No Injury to kidneys or bladder?: No Painful intercourse?: No Weak stream?: No Currently pregnant?: No Vaginal bleeding?: No Last menstrual period?: n  Gastrointestinal Nausea?: No Vomiting?: No Indigestion/heartburn?: No Diarrhea?: No Constipation?: No  Constitutional Fever: No Night sweats?: Yes Weight loss?: No Fatigue?: Yes  Skin Skin rash/lesions?: No Itching?: No  Eyes Blurred vision?: No Double vision?: No  Ears/Nose/Throat Sore throat?: No Sinus problems?: Yes  Hematologic/Lymphatic Swollen glands?: Yes Easy bruising?: Yes  Cardiovascular Leg swelling?: No Chest pain?: No  Respiratory Cough?: Yes Shortness of breath?: Yes  Endocrine Excessive thirst?: No  Musculoskeletal Back pain?: Yes Joint pain?: No  Neurological Headaches?: Yes Dizziness?: No  Psychologic Depression?: No Anxiety?: No  Physical Exam: BP 138/67   Pulse 73   Ht  (1.626 m)   Wt 150 lb (68 kg)   BMI 25.75 kg/m   Constitutional: Well nourished. Alert and oriented, No acute distress. HEENT: [place] AT, moist mucus membranes. Trachea midline, no masses. Cardiovascular: No clubbing, cyanosis, or edema. Respiratory: Normal respiratory effort, no increased work of breathing. GI: Abdomen is soft, non tender, non distended, no abdominal masses. Liver and spleen not palpable.  No hernias appreciated.  Stool sample for occult testing is not indicated.   GU: No CVA tenderness.  No bladder fullness or masses.  Atrophic external genitalia, normal pubic hair distribution, no lesions.  Normal urethral meatus, no lesions, no  prolapse, no discharge.   No urethral masses, tenderness and/or tenderness. No bladder fullness, tenderness or masses.  Pale vagina mucosa, poor estrogen effect, no discharge, no lesions, good pelvic support, no cystocele or rectocele noted.  Cervix and uterus are surgically absent.  No adnexal/parametria masses or tenderness noted.  Anus and perineum are without rashes or lesions.    Skin: No rashes, bruises or suspicious lesions. Lymph: No cervical or inguinal adenopathy. Neurologic: Grossly intact, no focal deficits, moving all 4 extremities. Psychiatric: Normal mood and affect.   Laboratory Data: Urinalysis: 0-5 RBC's.  0-5 WBC's.  See EPIC.  Procedure: Patient is placed in lithotomy position and her urethral meatus and vulva are cleansed with Betadine.  2% Lidocaine jelly was inserted into her urethra.  I then dilated her with Leta Jungling sounds to a 12f Walther sound without difficultly.  She tolerated the procedure well.     Assessment & Plan:  1. Urethral stricture:   Patient tolerated the dilation.  She will continue her daily trimethoprim and Hyophen qid.  Refills given.  RTC in 4 months.     2. History of hematuria  - completed hematuria work up in 01/2016- no worrisome GU findings  Return in about 4 months (around 01/17/2017) for dilation.  Michiel Cowboy, PA-C  [facility] [address] [place], [place] [address] [phone]

## 2016-09-15 ENCOUNTER — Other Ambulatory Visit: Payer: Self-pay | Admitting: *Deleted

## 2016-09-15 DIAGNOSIS — N359 Urethral stricture, unspecified: Secondary | ICD-10-CM

## 2016-09-17 ENCOUNTER — Encounter: Payer: Self-pay | Admitting: Urology

## 2016-09-17 ENCOUNTER — Other Ambulatory Visit
Admission: RE | Admit: 2016-09-17 | Discharge: 2016-09-17 | Disposition: A | Discharge status: Home / Self Care | Payer: Medicare HMO | Source: Ambulatory Visit | Attending: Urology | Admitting: Urology

## 2016-09-17 ENCOUNTER — Ambulatory Visit (INDEPENDENT_AMBULATORY_CARE_PROVIDER_SITE_OTHER): Payer: Medicare HMO | Admitting: Urology

## 2016-09-17 VITALS — BP 138/67 | HR 73 | Ht 64.0 in | Wt 150.0 lb

## 2016-09-17 DIAGNOSIS — Z87448 Personal history of other diseases of urinary system: Secondary | ICD-10-CM

## 2016-09-17 DIAGNOSIS — N359 Urethral stricture, unspecified: Secondary | ICD-10-CM | POA: Insufficient documentation

## 2016-09-17 LAB — URINALYSIS, COMPLETE (UACMP) WITH MICROSCOPIC
BACTERIA UA: NONE SEEN
BILIRUBIN URINE: NEGATIVE
Glucose, UA: NEGATIVE mg/dL
HGB URINE DIPSTICK: NEGATIVE
Ketones, ur: NEGATIVE mg/dL
LEUKOCYTES UA: NEGATIVE
NITRITE: NEGATIVE
PH: 7 (ref 5.0–8.0)
Protein, ur: NEGATIVE mg/dL
SPECIFIC GRAVITY, URINE: 1.02 (ref 1.005–1.030)

## 2016-09-17 MED ORDER — TRIMETHOPRIM 100 MG PO TABS: 100.0000 mg | ORAL_TABLET | Freq: Two times a day (BID) | ORAL | 4 refills | Status: DC

## 2016-09-17 MED ORDER — METH-HYO-M BL-BENZ ACD-PH SAL 81.6 MG PO TABS: 81.6000 mg | ORAL_TABLET | Freq: Four times a day (QID) | ORAL | 4 refills | Status: DC

## 2016-09-21 ENCOUNTER — Other Ambulatory Visit: Payer: Self-pay

## 2016-09-21 DIAGNOSIS — N390 Urinary tract infection, site not specified: Secondary | ICD-10-CM

## 2016-09-21 MED ORDER — TRIMETHOPRIM 100 MG PO TABS: 100.0000 mg | ORAL_TABLET | Freq: Two times a day (BID) | ORAL | 4 refills | Status: DC

## 2016-09-22 ENCOUNTER — Telehealth: Payer: Self-pay | Admitting: Urology

## 2016-09-22 NOTE — Telephone Encounter (Signed)
Pt went to [facility] in [place] for hyophen tablets.  Her insurance won't cover it.  Pt says it would cost $600.  Pharmacy told pt to have [place] call her insurance company to explain she really needs the meds but can't afford.  She needs (1) 4 x per day, she would like a 3 month supply.  Please give pt a call [phone]

## 2016-09-23 NOTE — Telephone Encounter (Signed)
Spoke with pt in reference to insurance covering hyophen. Made pt aware insurance denied coverage of medication. Made pt aware there are other medications that can be taken but they will cost more. Pt voiced concern of having to pay for medication. Reinforced with pt can cut down on the amount of tabs taken in a day and only buy as many pills as can afford. Pt then inquired about another pharmacy being cheaper. Reinforced with can call around to see if the prices are different. Pt voiced understanding of whole conversation.

## 2017-01-14 ENCOUNTER — Ambulatory Visit: Payer: Medicare HMO | Admitting: Urology

## 2017-02-04 ENCOUNTER — Ambulatory Visit: Payer: Medicare HMO | Admitting: Urology

## 2017-03-10 ENCOUNTER — Telehealth: Payer: Self-pay | Admitting: Urology

## 2017-03-10 ENCOUNTER — Other Ambulatory Visit: Payer: Self-pay | Admitting: *Deleted

## 2017-03-10 DIAGNOSIS — N359 Urethral stricture, unspecified: Secondary | ICD-10-CM

## 2017-03-10 NOTE — Progress Notes (Signed)
10:24 AM   Brenda Hayden 07-Nov-1950 [id number]  Referring provider: Elby Beck, NP [address] [place], [place] [address]  Chief Complaint  Patient presents with  . Urethral Stricture    Dilation    HPI: Patient is a 66 year old Caucasian female with a history of hematuria and urethral stricture who presents today for urethral dilation.    Her complaints today consist of frequency, dysuria and incontinence.  It has been 6 months since her last dilation.   She does not have a prior history of recurrent urinary tract infections, nephrolithiasis, trauma to the genitourinary tract or malignancies of the genitourinary tract.   She does not have a family medical history of nephrolithiasis, malignancies of the genitourinary tract or hematuria.   She not experiencing any suprapubic pain, abdominal pain or flank pain. She denies any recent fevers, chills, nausea or vomiting.   She is not a smoker.   She is not exposed to secondhand smoke.  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   She completed a workup for gross hematuria in August 2017 with CT urogram and cystoscopy. No worrisome GU findings were discovered.  Her UA today demonstrates 6-30 WBC's.     She is also finding the Hyophen cost prohibitive.     PNH: Past Medical History:  Diagnosis Date  . Asthma   . Glaucoma   . HLD (hyperlipidemia)   . HTN (hypertension)   . Incomplete bladder emptying   . Recurrent UTI   . Urethral stenosis   . Urethral stricture   . Urgency incontinence     Surgical History: Past Surgical History:  Procedure Laterality Date  . ABDOMINAL HYSTERECTOMY    . CATARACT EXTRACTION, BILATERAL    . GANGLION CYST EXCISION    . KNEE SURGERY    . NASAL SINUS SURGERY    . TONSILLECTOMY      Home Medications:  Allergies as of 03/11/2017      Reactions   Nubain [nalbuphine Hcl]    Amlodipine Other (See Comments)   "Muscle pain and weakness"   Atorvastatin  Other (See Comments)   Felt bad, loss of appetite   Ciprofloxacin-ciproflox Hcl Er    Fluticasone Other (See Comments)   Heart palpitations, dizziness    Latex    Nalbuphine Other (See Comments)   Penicillins Nausea And Vomiting   Singulair  [montelukast Sodium] Other (See Comments)      Medication List       Accurate as of 03/11/17 10:24 AM. Always use your most recent med list.          ADACEL 10-13-13.5 LF-MCG/0.5 injection Generic drug:  Tdap Adacel (Tdap Adolesn/Adult)(PF)2 Lf-(2.5-5-3-5)-5 Lf/0.5 mL IM syringe   amLODipine 5 MG tablet Commonly known as:  NORVASC amlodipine 5 mg tablet   aspirin 81 MG tablet Take 81 mg by mouth daily.   atorvastatin 20 MG tablet Commonly known as:  LIPITOR atorvastatin 20 mg tablet   azithromycin 250 MG tablet Commonly known as:  ZITHROMAX azithromycin 250 mg tablet   beclomethasone 40 MCG/ACT inhaler Commonly known as:  QVAR Inhale into the lungs 2 (two) times daily.   beclomethasone 80 MCG/ACT inhaler Commonly known as:  QVAR Inhale into the lungs.   carvedilol 6.25 MG tablet Commonly known as:  COREG carvedilol 6.25 mg tablet   cephALEXin 500 MG capsule Commonly known as:  KEFLEX cephalexin 500 mg capsule   COREG CR 10 MG 24 hr capsule  Generic drug:  carvedilol Coreg CR 10 mg capsule, extended release   cyclobenzaprine 10 MG tablet Commonly known as:  FLEXERIL cyclobenzaprine 10 mg tablet   doxycycline 100 MG capsule Commonly known as:  MONODOX doxycycline monohydrate 100 mg capsule   ENALAPRIL MALEATE PO enalapril maleate   Flax Seed Oil 1000 MG Caps Flax Seed Oil 1,000 mg capsule   daily   FLOVENT DISKUS 100 MCG/BLIST Aepb Generic drug:  Fluticasone Propionate (Inhal) Flovent Diskus 100 mcg/actuation powder for inhalation   fluticasone 50 MCG/ACT nasal spray Commonly known as:  FLONASE fluticasone 50 mcg/actuation nasal spray,suspension   hydrochlorothiazide 12.5 MG capsule Commonly known  as:  MICROZIDE Take 12.5 mg by mouth daily.   hydrochlorothiazide 12.5 MG tablet Commonly known as:  HYDRODIURIL Take by mouth.   HYDROcodone-acetaminophen 5-325 MG tablet Commonly known as:  NORCO/VICODIN hydrocodone 5 mg-acetaminophen 325 mg tablet   ILEVRO 0.3 % ophthalmic suspension Generic drug:  nepafenac Ilevro 0.3 % eye drops,suspension   latanoprost 0.005 % ophthalmic solution Commonly known as:  XALATAN latanoprost 0.005 % eye drops  INSTILL 1 DROP INTO AFFECTED EYE(S) BY OPHTHALMIC ROUTE ONCE DAILY INTHE EVENING   Meth-Hyo-M Bl-Benz Acd-Ph Sal 81.6 MG Tabs Commonly known as:  HYOPHEN Take 1 tablet (81.6 mg total) by mouth QID.   mometasone 50 MCG/ACT nasal spray Commonly known as:  NASONEX mometasone 50 mcg/actuation nasal spray   montelukast 10 MG tablet Commonly known as:  SINGULAIR montelukast 10 mg tablet   nitrofurantoin 100 MG capsule Commonly known as:  MACRODANTIN nitrofurantoin macrocrystal 100 mg capsule   phenylephrine 10 % ophthalmic solution Commonly known as:  NEO-SYNEPHRINE phenylephrine 10 % eye drops  Apply 1 drop(s) by ophthalmic route.to ou 1140   pravastatin 40 MG tablet Commonly known as:  PRAVACHOL Take 40 mg by mouth daily.   prednisoLONE acetate 1 % ophthalmic suspension Commonly known as:  PRED FORTE prednisolone acetate 1 % eye drops,suspension   predniSONE 20 MG tablet Commonly known as:  DELTASONE Take by mouth.   PROCTOZONE-HC 2.5 % rectal cream Generic drug:  hydrocortisone Proctozone-HC 2.5 % topical cream perineal applicator   SALMON OIL PO Take by mouth. Reported on 05/30/2015   sulfamethoxazole-trimethoprim 800-160 MG tablet Commonly known as:  BACTRIM DS,SEPTRA DS sulfamethoxazole 800 mg-trimethoprim 160 mg tablet   traMADol 50 MG tablet Commonly known as:  ULTRAM tramadol 50 mg tablet  TK 1 T PO Q 6 H PRF PAIN   travoprost (benzalkonium) 0.004 % ophthalmic solution Commonly known as:  TRAVATAN Apply to  eye.   trimethoprim 100 MG tablet Commonly known as:  TRIMPEX Take 1 tablet (100 mg total) by mouth 2 (two) times daily.   tropicamide 1 % ophthalmic solution Commonly known as:  MYDRIACYL tropicamide 1 % eye drops  Apply 1 drop(s) by ophthalmic route.to ou 1140   vitamin E 1000 UNIT capsule Take by mouth.   VIVOTIF DR capsule Generic drug:  typhoid Vivotif 2 billion unit capsule,delayed release   zafirlukast 20 MG tablet Commonly known as:  ACCOLATE zafirlukast 20 mg tablet   zolpidem 10 MG tablet Commonly known as:  AMBIEN zolpidem 10 mg tablet            Discharge Care Instructions        Start     Ordered   03/11/17 0000  BLADDER SCAN AMB NON-IMAGING     03/11/17 1020   Unscheduled  Urine Culture     03/11/17 1020  Allergies:  Allergies  Allergen Reactions  . Nubain [Nalbuphine Hcl]   . Amlodipine Other (See Comments)    "Muscle pain and weakness"  . Atorvastatin Other (See Comments)    Felt bad, loss of appetite  . Ciprofloxacin-Ciproflox Hcl Er   . Fluticasone Other (See Comments)    Heart palpitations, dizziness   . Latex   . Nalbuphine Other (See Comments)  . Penicillins Nausea And Vomiting  . Singulair  [Montelukast Sodium] Other (See Comments)    Family History: Family History  Problem Relation Age of Onset  . Colon cancer Mother   . Diabetes Father   . Kidney disease Neg Hx   . Prostate cancer Neg Hx   . Bladder Cancer Neg Hx   . Kidney cancer Neg Hx     Social History:  reports that she has never smoked. She has never used smokeless tobacco. She reports that she does not drink alcohol or use drugs.  ROS: UROLOGY Frequent Urination?: Yes Hard to postpone urination?: No Burning/pain with urination?: Yes Get up at night to urinate?: No Leakage of urine?: Yes Urine stream starts and stops?: No Trouble starting stream?: No Do you have to strain to urinate?: No Blood in urine?: No Urinary tract infection?: No Sexually  transmitted disease?: No Injury to kidneys or bladder?: No Painful intercourse?: No Weak stream?: No Currently pregnant?: No Vaginal bleeding?: No Last menstrual period?: n  Gastrointestinal Nausea?: No Vomiting?: No Indigestion/heartburn?: No Diarrhea?: No Constipation?: No  Constitutional Fever: No Night sweats?: No Weight loss?: No Fatigue?: No  Skin Skin rash/lesions?: No Itching?: No  Eyes Blurred vision?: No Double vision?: No  Ears/Nose/Throat Sore throat?: Yes Sinus problems?: Yes  Hematologic/Lymphatic Swollen glands?: No Easy bruising?: No  Cardiovascular Leg swelling?: No Chest pain?: No  Respiratory Cough?: Yes Shortness of breath?: Yes  Endocrine Excessive thirst?: No  Musculoskeletal Back pain?: Yes Joint pain?: No  Neurological Headaches?: Yes Dizziness?: No  Psychologic Depression?: No Anxiety?: No  Physical Exam: BP (!) 148/72   Pulse 83   Ht  (1.626 m)   Wt 144 lb (65.3 kg)   BMI 24.72 kg/m   Constitutional: Well nourished. Alert and oriented, No acute distress. HEENT: [place] AT, moist mucus membranes. Trachea midline, no masses. Cardiovascular: No clubbing, cyanosis, or edema. Respiratory: Normal respiratory effort, no increased work of breathing. Skin: No rashes, bruises or suspicious lesions. Lymph: No cervical or inguinal adenopathy. Neurologic: Grossly intact, no focal deficits, moving all 4 extremities. Psychiatric: Normal mood and affect.   Laboratory Data: Urinalysis: 6-30 WBC's.  See EPIC.  Pertinent imaging Results for KASSIDI, ELZA (MRN [id number]) as of 03/11/2017 10:26  Ref. Range 03/11/2017 10:22  Scan Result Unknown 12    Assessment & Plan:    1. Dysuria  - UA suspicious for infection, will send for culture  - No antibiotic prescribed today    2. Urethral stricture  - patient's PVR is 12 mL  - explained to the patient that she really does not need to have her urethra dilated and then  she become very tearful so I did not press the issue futher   3. History of hematuria  - completed hematuria work up in 01/2016- no worrisome GU findings  - no AMH seen on today's exam  Return for pending culture results.  Michiel Cowboy, PA-C  [facility] [address] [place], [place] [address] [phone]

## 2017-03-10 NOTE — Telephone Encounter (Signed)
When I spoke to pt to confirm her appt in [place] tomorrow, she asked about someone calling her insurance company or filling out a paper so she could get RX cheaper than $600.  Just FYI, she will ask you about this tomorrow.

## 2017-03-11 ENCOUNTER — Ambulatory Visit (INDEPENDENT_AMBULATORY_CARE_PROVIDER_SITE_OTHER): Payer: Medicare HMO | Admitting: Urology

## 2017-03-11 ENCOUNTER — Other Ambulatory Visit
Admission: RE | Admit: 2017-03-11 | Discharge: 2017-03-11 | Disposition: A | Discharge status: Home / Self Care | Payer: Medicare HMO | Source: Ambulatory Visit | Attending: Urology | Admitting: Urology

## 2017-03-11 ENCOUNTER — Encounter: Payer: Self-pay | Admitting: Urology

## 2017-03-11 ENCOUNTER — Ambulatory Visit: Payer: Medicare HMO | Admitting: Urology

## 2017-03-11 VITALS — BP 148/72 | HR 83 | Ht 64.0 in | Wt 144.0 lb

## 2017-03-11 DIAGNOSIS — Z87448 Personal history of other diseases of urinary system: Secondary | ICD-10-CM

## 2017-03-11 DIAGNOSIS — N359 Urethral stricture, unspecified: Secondary | ICD-10-CM | POA: Diagnosis not present

## 2017-03-11 DIAGNOSIS — R300 Dysuria: Secondary | ICD-10-CM | POA: Diagnosis not present

## 2017-03-11 LAB — URINALYSIS, COMPLETE (UACMP) WITH MICROSCOPIC
Bacteria, UA: NONE SEEN
Bilirubin Urine: NEGATIVE
GLUCOSE, UA: NEGATIVE mg/dL
HGB URINE DIPSTICK: NEGATIVE
KETONES UR: NEGATIVE mg/dL
Nitrite: NEGATIVE
PROTEIN: NEGATIVE mg/dL
RBC / HPF: NONE SEEN RBC/hpf (ref 0–5)
SPECIFIC GRAVITY, URINE: 1.015 (ref 1.005–1.030)
pH: 7 (ref 5.0–8.0)

## 2017-03-11 LAB — BLADDER SCAN AMB NON-IMAGING: SCAN RESULT: 12

## 2017-03-12 LAB — URINE CULTURE: CULTURE: NO GROWTH

## 2017-03-14 ENCOUNTER — Telehealth: Payer: Self-pay

## 2017-03-14 NOTE — Telephone Encounter (Signed)
-----   Message from Harle Battiest, PA-C sent at 03/13/2017  2:17 PM EDT ----- Please let Mrs. Zepeda know that her urine culture was negative.  We will be contacting her insurance company regarding the Hyophen.

## 2017-03-14 NOTE — Telephone Encounter (Signed)
LMOM- labs negative and will contact insurance about hyophen.

## 2017-03-15 ENCOUNTER — Telehealth: Payer: Self-pay

## 2017-03-15 NOTE — Telephone Encounter (Signed)
Spoke with pt insurance company in reference to completing a PA for hyophen. The pharmacist said that hyophen will ALWAYS be denied under medicare because the medication is not FDA or ADA approved. Pharmacist said that pt will either have to pay out of pocket or get another medication.

## 2017-05-19 NOTE — Progress Notes (Signed)
10:47 AM   Vivien PrestoRachel C Patel 07/11/1950 [id number]  Referring provider: Elby Beckrummett, Daniel D, NP [address] [place], [place] [address]  Chief Complaint  Patient presents with  . Urethral Stricture    HPI: Patient is a 66 year old Caucasian female with a history of hematuria and urethral stricture who presents today for urethral dilation.    Her complaints today consist of frequency, urgency, nocturia and incontinence.  It has been 10 months since her last dilation.   She does not have a prior history of recurrent urinary tract infections, nephrolithiasis, trauma to the genitourinary tract or malignancies of the genitourinary tract. She does not have a family medical history of nephrolithiasis, malignancies of the genitourinary tract or hematuria.   She not experiencing any suprapubic pain, abdominal pain or flank pain. She denies any recent fevers, chills, nausea or vomiting.  She is not a smoker.   She is not exposed to secondhand smoke.  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   She completed a workup for gross hematuria in August 2017 with CT urogram and cystoscopy. No worrisome GU findings were discovered.  Her UA today is negative.   She is also finding the Hyophen cost prohibitive.     PNH: Past Medical History:  Diagnosis Date  . Asthma   . Glaucoma   . HLD (hyperlipidemia)   . HTN (hypertension)   . Incomplete bladder emptying   . Recurrent UTI   . Urethral stenosis   . Urethral stricture   . Urgency incontinence     Surgical History: Past Surgical History:  Procedure Laterality Date  . ABDOMINAL HYSTERECTOMY    . CATARACT EXTRACTION, BILATERAL    . GANGLION CYST EXCISION    . KNEE SURGERY    . NASAL SINUS SURGERY    . TONSILLECTOMY      Home Medications:  Allergies as of 05/20/2017      Reactions   Nubain [nalbuphine Hcl]    Amlodipine Other (See Comments)   "Muscle pain and weakness"   Atorvastatin Other (See Comments)     Felt bad, loss of appetite   Ciprofloxacin-ciproflox Hcl Er    Fluticasone Other (See Comments)   Heart palpitations, dizziness    Latex    Nalbuphine Other (See Comments)   Penicillins Nausea And Vomiting   Singulair  [montelukast Sodium] Other (See Comments)      Medication List        Accurate as of 05/20/17 10:47 AM. Always use your most recent med list.          aspirin 81 MG tablet Take 81 mg by mouth daily.   beclomethasone 80 MCG/ACT inhaler Commonly known as:  QVAR Inhale 2 puffs into the lungs 2 (two) times daily.   hydrochlorothiazide 12.5 MG capsule Commonly known as:  MICROZIDE Take 12.5 mg by mouth daily.   Meth-Hyo-M Bl-Benz Acd-Ph Sal 81.6 MG Tabs Commonly known as:  HYOPHEN Take 1 tablet (81.6 mg total) by mouth QID.   pravastatin 40 MG tablet Commonly known as:  PRAVACHOL Take 40 mg by mouth daily.   Salmon Oil Caps Take 1 capsule by mouth daily.   trimethoprim 100 MG tablet Commonly known as:  TRIMPEX Take 1 tablet (100 mg total) by mouth 2 (two) times daily.   vitamin E 1000 UNIT capsule Take 1,000 Units by mouth daily.       Allergies:  Allergies  Allergen Reactions  . Nubain [Nalbuphine Hcl]   .  Amlodipine Other (See Comments)    "Muscle pain and weakness"  . Atorvastatin Other (See Comments)    Felt bad, loss of appetite  . Ciprofloxacin-Ciproflox Hcl Er   . Fluticasone Other (See Comments)    Heart palpitations, dizziness   . Latex   . Nalbuphine Other (See Comments)  . Penicillins Nausea And Vomiting  . Singulair  [Montelukast Sodium] Other (See Comments)    Family History: Family History  Problem Relation Age of Onset  . Colon cancer Mother   . Diabetes Father   . Kidney disease Neg Hx   . Prostate cancer Neg Hx   . Bladder Cancer Neg Hx   . Kidney cancer Neg Hx     Social History:  reports that  has never smoked. she has never used smokeless tobacco. She reports that she does not drink alcohol or use  drugs.  ROS: UROLOGY Frequent Urination?: Yes Hard to postpone urination?: Yes Burning/pain with urination?: No Get up at night to urinate?: Yes Leakage of urine?: Yes Urine stream starts and stops?: No Trouble starting stream?: No Do you have to strain to urinate?: No Blood in urine?: No Urinary tract infection?: No Sexually transmitted disease?: No Injury to kidneys or bladder?: No Painful intercourse?: No Weak stream?: No Currently pregnant?: No Vaginal bleeding?: No  Gastrointestinal Nausea?: No Vomiting?: No Indigestion/heartburn?: No Diarrhea?: No Constipation?: No  Constitutional Fever: No Night sweats?: Yes Weight loss?: No Fatigue?: No  Skin Skin rash/lesions?: No Itching?: No  Eyes Blurred vision?: No Double vision?: No  Ears/Nose/Throat Sore throat?: No Sinus problems?: Yes  Hematologic/Lymphatic Swollen glands?: No Easy bruising?: No  Cardiovascular Leg swelling?: No Chest pain?: No  Respiratory Cough?: Yes Shortness of breath?: No  Endocrine Excessive thirst?: No  Musculoskeletal Back pain?: No Joint pain?: No  Neurological Headaches?: No Dizziness?: No  Psychologic Depression?: No Anxiety?: No  Physical Exam: BP (!) 148/68   Pulse 98   Ht 5\' 4"  (1.626 m)   Wt 146 lb (66.2 kg)   BMI 25.06 kg/m   Constitutional: Well nourished. Alert and oriented, No acute distress. HEENT: [place] AT, moist mucus membranes. Trachea midline, no masses. Cardiovascular: No clubbing, cyanosis, or edema. Respiratory: Normal respiratory effort, no increased work of breathing. Skin: No rashes, bruises or suspicious lesions. Lymph: No cervical or inguinal adenopathy. Neurologic: Grossly intact, no focal deficits, moving all 4 extremities. Psychiatric: Normal mood and affect.   Laboratory Data: Urinalysis: Negative  See EPIC.  Procedure Patient is placed in stirrups and her urethral meatus and vulva are cleansed with Betadine.  2%  Lidocaine jelly was inserted into her urethra.  I then dilated her with Leta JunglingWalther sounds to a 22 without difficultly.  She tolerated the procedure well.  She will return in 4 months.  Assessment & Plan:    1. Urethral stricture  - patient's PVR is 20 mL  - dilation performed today  - RTC in 4 months for dilation  2. History of hematuria  - completed hematuria work up in 01/2016- no worrisome GU findings  - no AMH seen on today's exam  Return in about 4 months (around 09/18/2017) for dilation.  Michiel CowboySHANNON Caidin Heidenreich, PA-C  [facility] [address] [place], [place] [address] [phone]

## 2017-05-20 ENCOUNTER — Other Ambulatory Visit: Payer: Self-pay

## 2017-05-20 ENCOUNTER — Ambulatory Visit (INDEPENDENT_AMBULATORY_CARE_PROVIDER_SITE_OTHER): Payer: Medicare HMO | Admitting: Urology

## 2017-05-20 ENCOUNTER — Encounter: Payer: Self-pay | Admitting: Urology

## 2017-05-20 ENCOUNTER — Other Ambulatory Visit
Admission: RE | Admit: 2017-05-20 | Discharge: 2017-05-20 | Disposition: A | Discharge status: Home / Self Care | Payer: Medicare HMO | Source: Ambulatory Visit | Attending: Urology | Admitting: Urology

## 2017-05-20 VITALS — BP 148/68 | HR 98 | Ht 64.0 in | Wt 146.0 lb

## 2017-05-20 DIAGNOSIS — N3592 Unspecified urethral stricture, female: Secondary | ICD-10-CM

## 2017-05-20 DIAGNOSIS — Z87448 Personal history of other diseases of urinary system: Secondary | ICD-10-CM

## 2017-05-20 LAB — URINALYSIS, COMPLETE (UACMP) WITH MICROSCOPIC
BACTERIA UA: NONE SEEN
BILIRUBIN URINE: NEGATIVE
GLUCOSE, UA: NEGATIVE mg/dL
HGB URINE DIPSTICK: NEGATIVE
Ketones, ur: NEGATIVE mg/dL
LEUKOCYTES UA: NEGATIVE
NITRITE: NEGATIVE
PH: 7 (ref 5.0–8.0)
Protein, ur: NEGATIVE mg/dL
SPECIFIC GRAVITY, URINE: 1.025 (ref 1.005–1.030)

## 2017-06-12 IMAGING — CT CT ABD-PEL WO/W CM
3 of 10 series · 11 of 46 positions shown, 18 images · IV contrast (iopamidol)
Comparison: CT scan 09/27/2005

CLINICAL DATA: Hematuria.

EXAM:
CT ABDOMEN AND PELVIS WITHOUT AND WITH CONTRAST
TECHNIQUE: Multidetector CT imaging of the abdomen and pelvis was performed
following the standard protocol before and following the bolus
administration of intravenous contrast.
CONTRAST:  125mL LNWNXT-U66 IOPAMIDOL (LNWNXT-U66) INJECTION 61%

[Series 2: hematuria > 45 wo · axial · 0.70mm/px · z∈[-840,-505]mm · 8 of 87 slices shown, 13 images]
[im 10/87  soft-tissue]
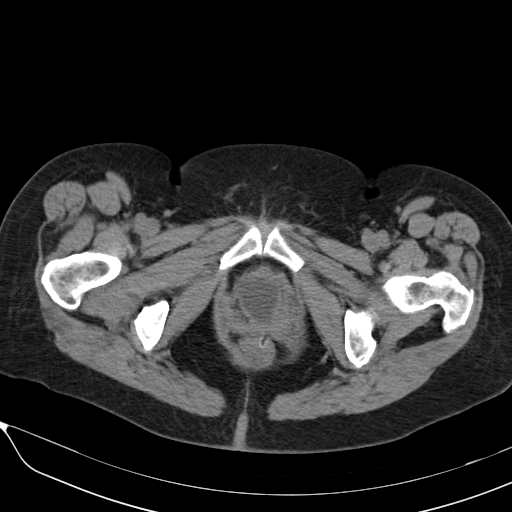
[im 10/87  bone]
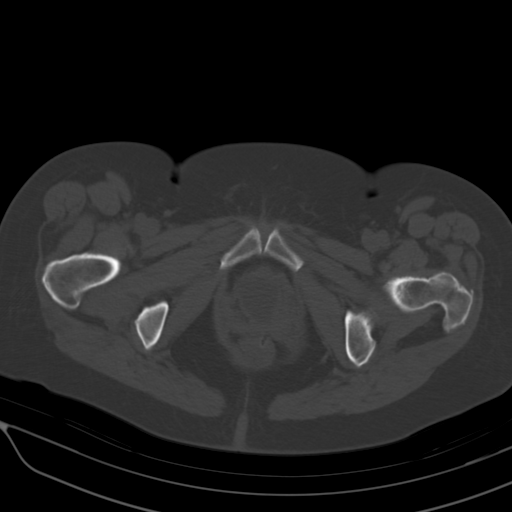
[im 20/87  soft-tissue]
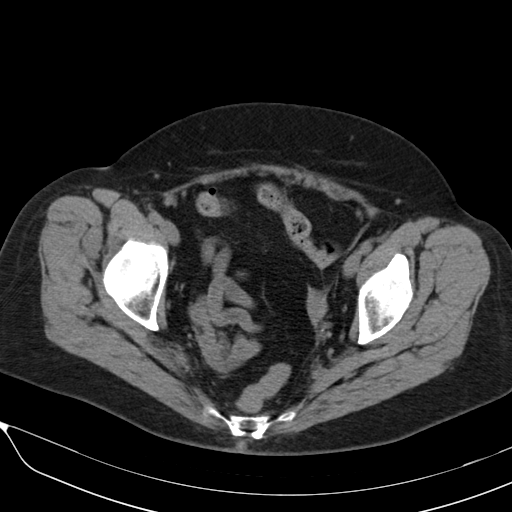
[im 29/87  soft-tissue]
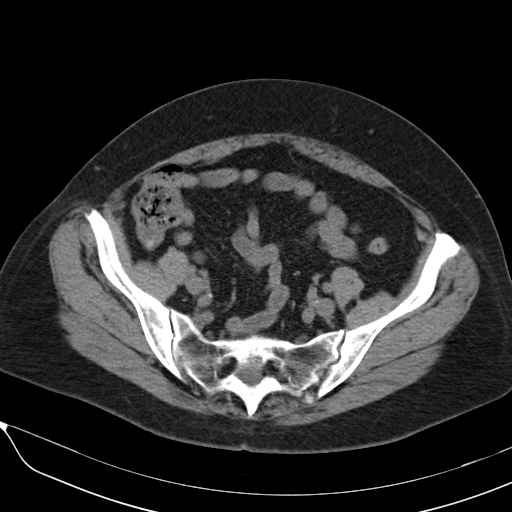
[im 39/87  soft-tissue]
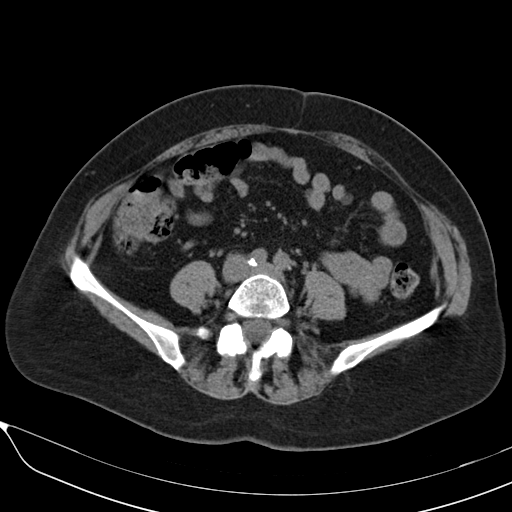
[im 48/87  soft-tissue]
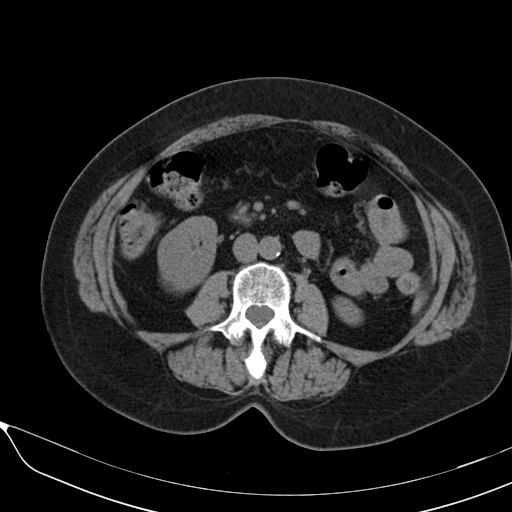
[im 48/87  lung]
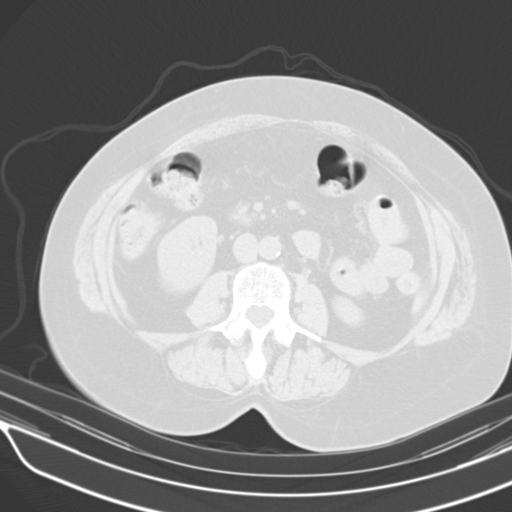
[im 58/87  soft-tissue]
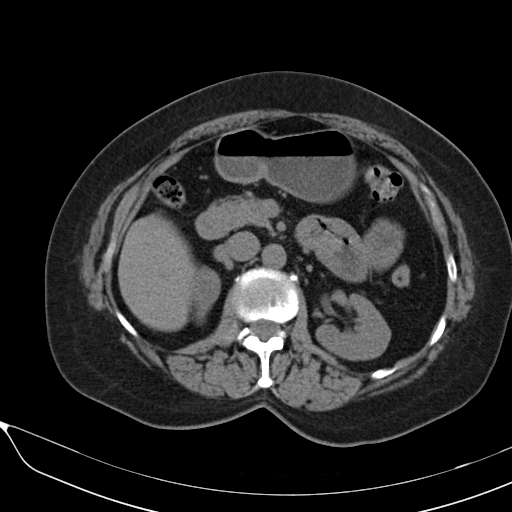
[im 58/87  lung]
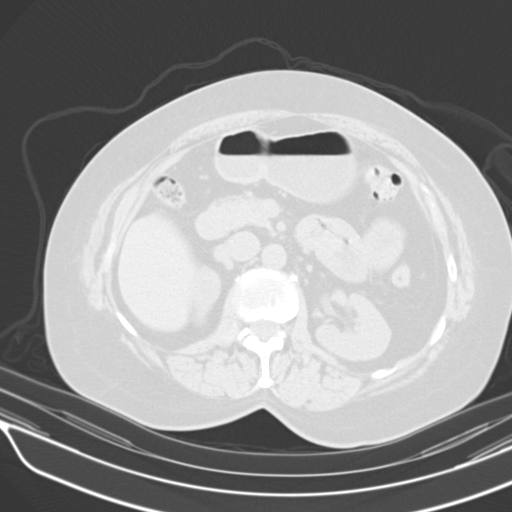
[im 67/87  soft-tissue]
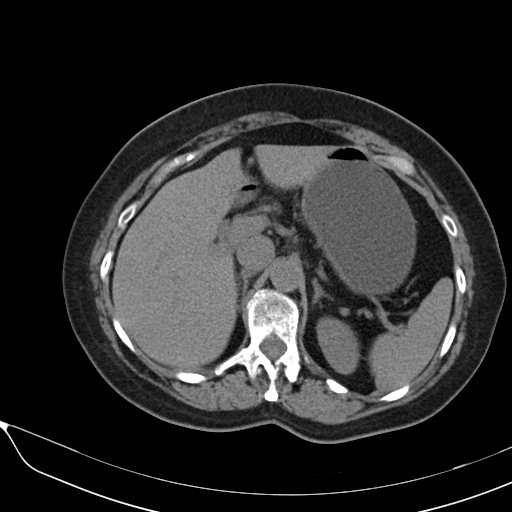
[im 67/87  lung]
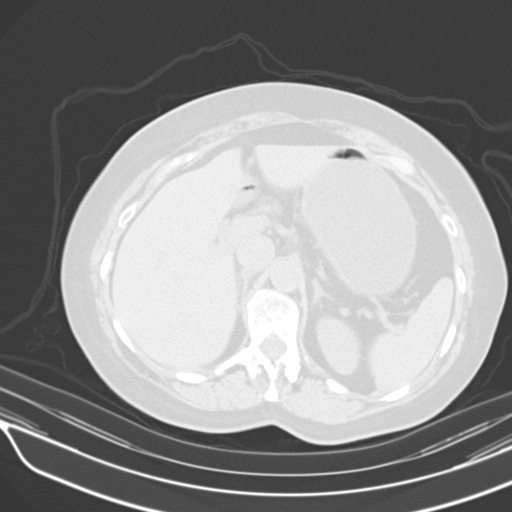
[im 77/87  soft-tissue]
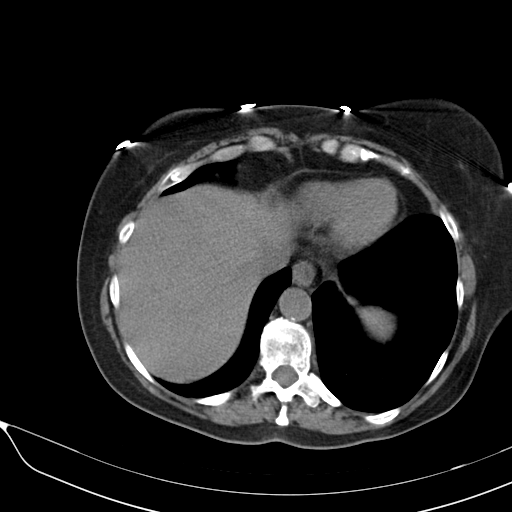
[im 77/87  lung]
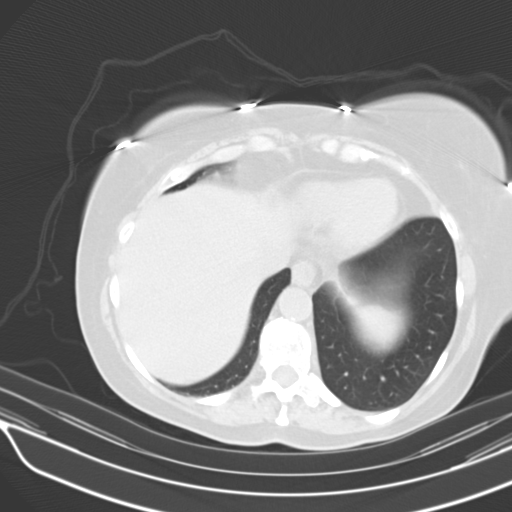

[Series 603: coronal · coronal · 0.85mm/px · 2 of 110 slices shown, 3 images]
[im 37/110  soft-tissue]
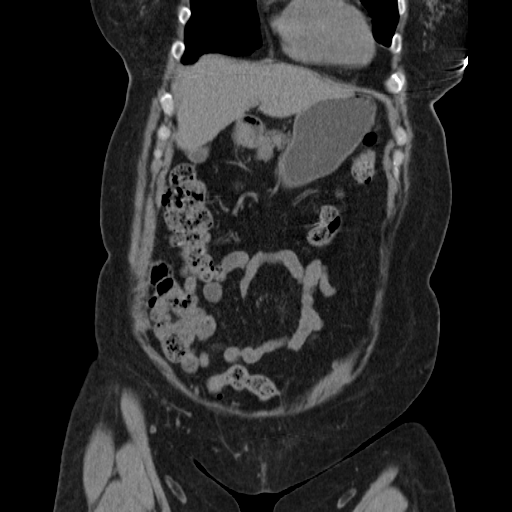
[im 37/110  bone]
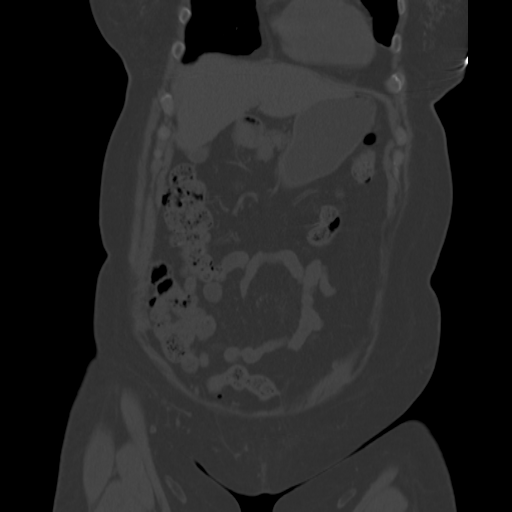
[im 73/110  soft-tissue]
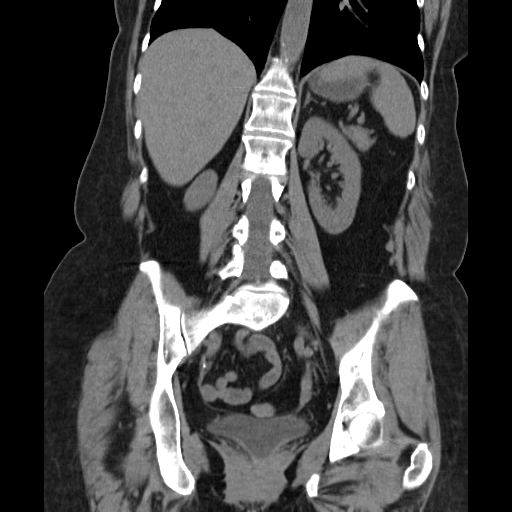

[Series 604: sagittal · sagittal · 0.85mm/px · 1 of 140 slices shown, 2 images]
[im 128/140  soft-tissue]
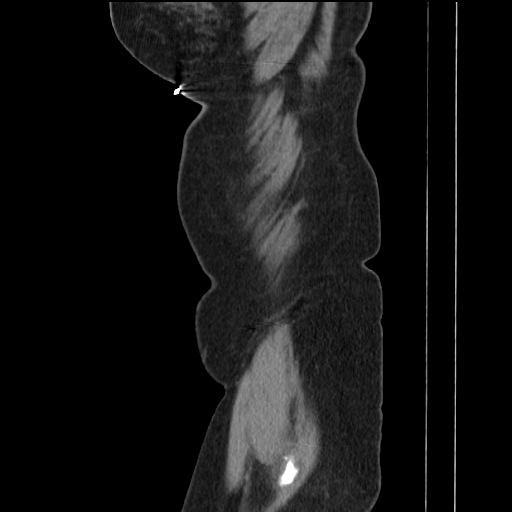
[im 128/140  bone]
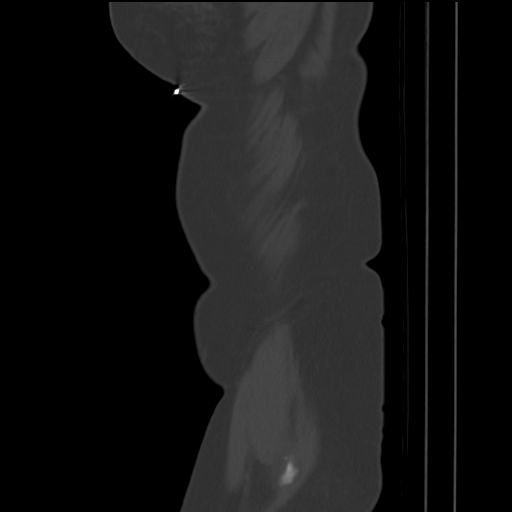

[11 of 46 positions shown; findings below may reference images not displayed]

FINDINGS: Lower chest: The lung bases are clear of acute process. No pleural
effusion or pulmonary lesions. The heart is normal in size. No
pericardial effusion. The distal esophagus and aorta are
unremarkable.

Hepatobiliary: No focal hepatic lesions or intrahepatic biliary
dilatation. Small gallstones noted in the gallbladder. No common
bile duct dilatation.

Pancreas: No mass, inflammation or ductal dilatation.

Spleen: Normal size.  No focal lesions.

Adrenals/Urinary Tract: The adrenal glands are normal.

Small lower pole right renal calculus. No ureteral or bladder
calculi. Both kidneys demonstrate normal enhancement/ perfusion. No
worrisome renal lesions. A few tiny cysts are noted.

The delayed images do not demonstrate any significant collecting
system abnormalities. Both ureters are normal. The bladder is
normal.

Stomach/Bowel: The stomach, duodenum, small bowel and colon are
grossly normal without oral contrast. No inflammatory changes, mass
lesions or obstructive findings. The terminal ileum is normal. The
appendix is normal.

Vascular/Lymphatic: Moderate atherosclerotic calcifications
involving the distal aorta and iliac arteries. No aneurysm or
dissection. The branch vessels are patent. The major venous
structures are normal.

Reproductive: Status post hysterectomy. Both ovaries are still
present and appear normal.

Other: No pelvic mass or adenopathy. No free pelvic fluid
collections. No inguinal mass or adenopathy. No abdominal wall
hernia or subcutaneous lesions.

Musculoskeletal: No significant bony findings.
IMPRESSION: 1. Small lower pole right renal calculus. No obstructing ureteral
calculi or bladder calculi. No renal or bladder mass.
2. No acute abdominal/pelvic findings, mass lesions or
lymphadenopathy.
3. Status post hysterectomy.  Both ovaries appear normal.

## 2017-07-01 ENCOUNTER — Other Ambulatory Visit: Payer: Self-pay | Admitting: Urology

## 2017-07-01 ENCOUNTER — Telehealth: Payer: Self-pay | Admitting: Urology

## 2017-07-01 DIAGNOSIS — N390 Urinary tract infection, site not specified: Secondary | ICD-10-CM

## 2017-07-01 MED ORDER — TRIMETHOPRIM 100 MG PO TABS: 100.0000 mg | ORAL_TABLET | Freq: Two times a day (BID) | ORAL | 4 refills | Status: DC

## 2017-07-01 MED ORDER — METH-HYO-M BL-BENZ ACD-PH SAL 81.6 MG PO TABS: 81.6000 mg | ORAL_TABLET | Freq: Four times a day (QID) | ORAL | 4 refills | Status: DC

## 2017-07-01 NOTE — Progress Notes (Signed)
Refills for trimethoprim and Hyophen sent to [facility].

## 2017-07-01 NOTE — Telephone Encounter (Signed)
Patient is asking for refills on her trimethoprim and Hyophen Sent to [facility]  Thanks, [employer]

## 2017-10-07 ENCOUNTER — Ambulatory Visit: Payer: Medicare HMO | Admitting: Urology

## 2017-11-29 ENCOUNTER — Telehealth: Payer: Self-pay | Admitting: Urology

## 2017-11-29 DIAGNOSIS — H04123 Dry eye syndrome of bilateral lacrimal glands: Secondary | ICD-10-CM | POA: Insufficient documentation

## 2017-11-29 DIAGNOSIS — H26499 Other secondary cataract, unspecified eye: Secondary | ICD-10-CM | POA: Insufficient documentation

## 2017-11-29 DIAGNOSIS — H401190 Primary open-angle glaucoma, unspecified eye, stage unspecified: Secondary | ICD-10-CM | POA: Insufficient documentation

## 2017-11-29 NOTE — Telephone Encounter (Signed)
Mrs. Brenda FriedlanderFlorence will need to go to the lab for an UA [place]

## 2017-11-29 NOTE — Progress Notes (Signed)
10:32 AM   Brenda Hayden 12/13/1950 [id number]  Referring provider: Elby Beckrummett, Daniel D, NP [address] [place], [place] [address]  Chief Complaint  Patient presents with  . Urethral Stricture    HPI: Patient is a 67 year old Caucasian female with a history of hematuria and urethral stricture who presents today for urethral dilation.    Her complaints today consist of frequency, nocturia and incontinence.  It has been 6 months since her last dilation.  Patient denies any gross hematuria, dysuria or suprapubic/flank pain.  Patient denies any fevers, chills, nausea or vomiting.    She does not have a prior history of recurrent urinary tract infections, nephrolithiasis, trauma to the genitourinary tract or malignancies of the genitourinary tract.  She does not have a family medical history of nephrolithiasis, malignancies of the genitourinary tract or hematuria.   She is not a smoker.   She is not exposed to secondhand smoke.  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   She completed a workup for gross hematuria in August 2017 with CT urogram and cystoscopy. No worrisome GU findings were discovered.  Her UA today is positive for 6-10 WBC's and 0-5 RBC's.     PMH: Past Medical History:  Diagnosis Date  . Asthma   . Glaucoma   . HLD (hyperlipidemia)   . HTN (hypertension)   . Incomplete bladder emptying   . Recurrent UTI   . Urethral stenosis   . Urethral stricture   . Urgency incontinence     Surgical History: Past Surgical History:  Procedure Laterality Date  . ABDOMINAL HYSTERECTOMY    . CATARACT EXTRACTION, BILATERAL    . GANGLION CYST EXCISION    . KNEE SURGERY    . NASAL SINUS SURGERY    . TONSILLECTOMY      Home Medications:  Allergies as of 12/02/2017      Reactions   Nubain [nalbuphine Hcl]    Amlodipine Other (See Comments)   "Muscle pain and weakness"   Atorvastatin Other (See Comments)   Felt bad, loss of appetite   Ciprofloxacin-ciproflox Hcl Er    Fluticasone Other (See Comments)   Heart palpitations, dizziness    Latex    Nalbuphine Other (See Comments)   Penicillins Nausea And Vomiting   Singulair  [montelukast Sodium] Other (See Comments)      Medication List        Accurate as of 12/02/17 10:32 AM. Always use your most recent med list.          aspirin 81 MG tablet Take 81 mg by mouth daily.   beclomethasone 80 MCG/ACT inhaler Commonly known as:  QVAR Inhale 2 puffs into the lungs 2 (two) times daily.   hydrochlorothiazide 12.5 MG capsule Commonly known as:  MICROZIDE Take 12.5 mg by mouth daily.   Meth-Hyo-M Bl-Benz Acd-Ph Sal 81.6 MG Tabs Commonly known as:  HYOPHEN Take 1 tablet (81.6 mg total) by mouth QID.   pravastatin 40 MG tablet Commonly known as:  PRAVACHOL Take 40 mg by mouth daily.   Salmon Oil Caps Take 1 capsule by mouth daily.   trimethoprim 100 MG tablet Commonly known as:  TRIMPEX Take 1 tablet (100 mg total) by mouth 2 (two) times daily.   vitamin E 1000 UNIT capsule Take 1,000 Units by mouth daily.       Allergies:  Allergies  Allergen Reactions  . Nubain [Nalbuphine Hcl]   . Amlodipine Other (See Comments)    "  Muscle pain and weakness"  . Atorvastatin Other (See Comments)    Felt bad, loss of appetite  . Ciprofloxacin-Ciproflox Hcl Er   . Fluticasone Other (See Comments)    Heart palpitations, dizziness   . Latex   . Nalbuphine Other (See Comments)  . Penicillins Nausea And Vomiting  . Singulair  [Montelukast Sodium] Other (See Comments)    Family History: Family History  Problem Relation Age of Onset  . Colon cancer Mother   . Diabetes Father   . Kidney disease Neg Hx   . Prostate cancer Neg Hx   . Bladder Cancer Neg Hx   . Kidney cancer Neg Hx     Social History:  reports that she has never smoked. She has never used smokeless tobacco. She reports that she does not drink alcohol or use drugs.  ROS: UROLOGY Frequent  Urination?: Yes Hard to postpone urination?: No Burning/pain with urination?: No Get up at night to urinate?: Yes Leakage of urine?: Yes Urine stream starts and stops?: No Trouble starting stream?: No Do you have to strain to urinate?: No Blood in urine?: No Urinary tract infection?: No Sexually transmitted disease?: No Injury to kidneys or bladder?: No Painful intercourse?: No Weak stream?: No Currently pregnant?: No Vaginal bleeding?: No  Gastrointestinal Nausea?: No Vomiting?: No Indigestion/heartburn?: No Diarrhea?: No Constipation?: No  Constitutional Fever: No Night sweats?: No Weight loss?: No Fatigue?: No  Skin Skin rash/lesions?: No Itching?: No  Eyes Blurred vision?: No Double vision?: No  Ears/Nose/Throat Sore throat?: No Sinus problems?: Yes  Hematologic/Lymphatic Swollen glands?: No Easy bruising?: No  Cardiovascular Leg swelling?: No Chest pain?: No  Respiratory Cough?: No Shortness of breath?: No  Endocrine Excessive thirst?: No  Musculoskeletal Back pain?: No Joint pain?: No  Neurological Headaches?: Yes Dizziness?: No  Psychologic Depression?: No Anxiety?: No  Physical Exam: BP (!) 122/54   Pulse 87   Resp 16   Ht 5\' 3"  (1.6 m)   Wt 150 lb (68 kg)   SpO2 96%   BMI 26.57 kg/m   Constitutional: Well nourished. Alert and oriented, No acute distress. HEENT: [place] AT, moist mucus membranes. Trachea midline, no masses. Cardiovascular: No clubbing, cyanosis, or edema. Respiratory: Normal respiratory effort, no increased work of breathing. GI: Abdomen is soft, non tender, non distended, no abdominal masses. Liver and spleen not palpable.  No hernias appreciated.  Stool sample for occult testing is not indicated.   GU: No CVA tenderness.  No bladder fullness or masses.  Atrophic external genitalia, normal pubic hair distribution, no lesions.  Normal urethral meatus, no lesions, no prolapse, no discharge.   No urethral masses,  tenderness and/or tenderness. No bladder fullness, tenderness or masses. Pale vagina mucosa, poor estrogen effect, no discharge, no lesions, good pelvic support, no cystocele or rectocele noted.  Cervix and uterus are surgically absent.  No adnexal/parametria masses or tenderness noted.  Anus and perineum are without rashes or lesions.    Skin: No rashes, bruises or suspicious lesions. Lymph: No cervical or inguinal adenopathy. Neurologic: Grossly intact, no focal deficits, moving all 4 extremities. Psychiatric: Normal mood and affect.  Laboratory Data: Urinalysis: 6-10 WBC's.  0-5 RBC's.  See EPIC.  Procedure Patient is placed in stirrups and her urethral meatus and vulva are cleansed with Betadine.  2% Lidocaine jelly was inserted into her urethra.  I then dilated her with Leta Jungling sounds to a 26 without difficultly.  She tolerated the procedure well.  She will return in 4 months.  Assessment & Plan:    1. Urethral stricture Dilation performed today RTC in 4 months for dilation  2. History of hematuria completed hematuria work up in 01/2016- no worrisome GU findings 0-5 RBC's on today's UA Patient denies any gross hematuria  Return in about 3 months (around 03/04/2018) for dilation and UA .  Michiel Cowboy, PA-C  [facility] [address] [place], [place] [address] [phone]

## 2017-12-02 ENCOUNTER — Ambulatory Visit (INDEPENDENT_AMBULATORY_CARE_PROVIDER_SITE_OTHER): Payer: Medicare HMO | Admitting: Urology

## 2017-12-02 ENCOUNTER — Encounter: Payer: Self-pay | Admitting: Urology

## 2017-12-02 ENCOUNTER — Other Ambulatory Visit
Admission: RE | Admit: 2017-12-02 | Discharge: 2017-12-02 | Disposition: A | Discharge status: Home / Self Care | Payer: Medicare HMO | Source: Ambulatory Visit | Attending: Urology | Admitting: Urology

## 2017-12-02 ENCOUNTER — Other Ambulatory Visit: Payer: Self-pay

## 2017-12-02 VITALS — BP 122/54 | HR 87 | Resp 16 | Ht 63.0 in | Wt 150.0 lb

## 2017-12-02 DIAGNOSIS — Z87448 Personal history of other diseases of urinary system: Secondary | ICD-10-CM

## 2017-12-02 DIAGNOSIS — N390 Urinary tract infection, site not specified: Secondary | ICD-10-CM | POA: Diagnosis present

## 2017-12-02 DIAGNOSIS — N3592 Unspecified urethral stricture, female: Secondary | ICD-10-CM | POA: Diagnosis not present

## 2017-12-02 LAB — URINALYSIS, COMPLETE (UACMP) WITH MICROSCOPIC
Bacteria, UA: NONE SEEN
Glucose, UA: NEGATIVE mg/dL
KETONES UR: NEGATIVE mg/dL
Leukocytes, UA: NEGATIVE
Nitrite: NEGATIVE
PH: 7 (ref 5.0–8.0)
Specific Gravity, Urine: 1.02 (ref 1.005–1.030)

## 2018-03-10 ENCOUNTER — Ambulatory Visit (INDEPENDENT_AMBULATORY_CARE_PROVIDER_SITE_OTHER): Payer: Medicare HMO | Admitting: Urology

## 2018-03-10 ENCOUNTER — Other Ambulatory Visit
Admission: RE | Admit: 2018-03-10 | Discharge: 2018-03-10 | Disposition: A | Discharge status: Home / Self Care | Payer: Medicare HMO | Source: Ambulatory Visit | Attending: Urology | Admitting: Urology

## 2018-03-10 ENCOUNTER — Encounter: Payer: Self-pay | Admitting: Urology

## 2018-03-10 ENCOUNTER — Other Ambulatory Visit: Payer: Self-pay

## 2018-03-10 VITALS — BP 136/53 | HR 84 | Ht 64.0 in | Wt 143.0 lb

## 2018-03-10 DIAGNOSIS — N390 Urinary tract infection, site not specified: Secondary | ICD-10-CM | POA: Diagnosis present

## 2018-03-10 DIAGNOSIS — N3592 Unspecified urethral stricture, female: Secondary | ICD-10-CM

## 2018-03-10 DIAGNOSIS — Z87448 Personal history of other diseases of urinary system: Secondary | ICD-10-CM | POA: Diagnosis not present

## 2018-03-10 LAB — URINALYSIS, COMPLETE (UACMP) WITH MICROSCOPIC
Bacteria, UA: NONE SEEN
Bilirubin Urine: NEGATIVE
Glucose, UA: NEGATIVE mg/dL
HGB URINE DIPSTICK: NEGATIVE
Ketones, ur: NEGATIVE mg/dL
Leukocytes, UA: NEGATIVE
Nitrite: NEGATIVE
Protein, ur: NEGATIVE mg/dL
SPECIFIC GRAVITY, URINE: 1.015 (ref 1.005–1.030)
pH: 7 (ref 5.0–8.0)

## 2018-03-10 MED ORDER — TRIMETHOPRIM 100 MG PO TABS
100.0000 mg | ORAL_TABLET | Freq: Every day | ORAL | 3 refills | Status: DC
Start: 2018-03-10 — End: 2021-01-05

## 2018-03-10 MED ORDER — METH-HYO-M BL-BENZ ACD-PH SAL 81.6 MG PO TABS
81.6000 mg | ORAL_TABLET | Freq: Four times a day (QID) | ORAL | 4 refills | Status: DC
Start: 2018-03-10 — End: 2019-05-21

## 2018-03-10 NOTE — Progress Notes (Signed)
10:47 AM   Brenda Hayden 1951-04-09 [id number]  Referring provider: Elby Beck, NP [address] [place], [place] [address]  Chief Complaint  Patient presents with  . Follow-up    3 month    HPI: Patient is a 67 year old Caucasian female with a history of hematuria and urethral stricture who presents today for urethral dilation.    History of hematuria She is not a smoker.   She is not exposed to secondhand smoke.  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  She completed a workup for gross hematuria in August 2017 with CT urogram and cystoscopy. No worrisome GU findings were discovered.  She denies any gross hematuria.  Her UA today 0-5 WBC's and 0-5 RBC's.    Urethral stricture She is complaining of frequency, nocturia and incontinence.  She had her last urethral dilation in June 2019.  She is currently taking Hyophen and trimethoprim for her urinary symptoms.  She had been taking trimethoprim twice daily 2 or 3 times a week.  Patient denies any gross hematuria, dysuria or suprapubic/flank pain.  Patient denies any fevers, chills, nausea or vomiting.   PMH: Past Medical History:  Diagnosis Date  . Asthma   . Glaucoma   . HLD (hyperlipidemia)   . HTN (hypertension)   . Incomplete bladder emptying   . Recurrent UTI   . Urethral stenosis   . Urethral stricture   . Urgency incontinence     Surgical History: Past Surgical History:  Procedure Laterality Date  . ABDOMINAL HYSTERECTOMY    . CATARACT EXTRACTION, BILATERAL    . GANGLION CYST EXCISION    . KNEE SURGERY    . NASAL SINUS SURGERY    . TONSILLECTOMY      Home Medications:  Allergies as of 03/10/2018      Reactions   Nubain [nalbuphine Hcl]    Amlodipine Other (See Comments)   "Muscle pain and weakness"   Atorvastatin Other (See Comments)   Felt bad, loss of appetite   Ciprofloxacin-ciproflox Hcl Er    Fluticasone Other (See Comments)   Heart palpitations, dizziness    Latex    Nalbuphine Other (See Comments)   Penicillins Nausea And Vomiting   Singulair  [montelukast Sodium] Other (See Comments)      Medication List        Accurate as of 03/10/18 10:47 AM. Always use your most recent med list.          aspirin 81 MG tablet Take 81 mg by mouth daily.   beclomethasone 80 MCG/ACT inhaler Commonly known as:  QVAR Inhale 2 puffs into the lungs 2 (two) times daily.   hydrochlorothiazide 12.5 MG capsule Commonly known as:  MICROZIDE Take 12.5 mg by mouth daily.   Meth-Hyo-M Bl-Benz Acd-Ph Sal 81.6 MG Tabs Take 1 tablet (81.6 mg total) by mouth QID.   pravastatin 40 MG tablet Commonly known as:  PRAVACHOL Take 40 mg by mouth daily.   Salmon Oil Caps Take 1 capsule by mouth daily.   trimethoprim 100 MG tablet Commonly known as:  TRIMPEX Take 1 tablet (100 mg total) by mouth daily.   vitamin E 1000 UNIT capsule Take 1,000 Units by mouth daily.       Allergies:  Allergies  Allergen Reactions  . Nubain [Nalbuphine Hcl]   . Amlodipine Other (See Comments)    "Muscle pain and weakness"  . Atorvastatin Other (See Comments)    Felt bad, loss  of appetite  . Ciprofloxacin-Ciproflox Hcl Er   . Fluticasone Other (See Comments)    Heart palpitations, dizziness   . Latex   . Nalbuphine Other (See Comments)  . Penicillins Nausea And Vomiting  . Singulair  [Montelukast Sodium] Other (See Comments)    Family History: Family History  Problem Relation Age of Onset  . Colon cancer Mother   . Diabetes Father   . Kidney disease Neg Hx   . Prostate cancer Neg Hx   . Bladder Cancer Neg Hx   . Kidney cancer Neg Hx     Social History:  reports that she has never smoked. She has never used smokeless tobacco. She reports that she does not drink alcohol or use drugs.  ROS: UROLOGY Frequent Urination?: No Hard to postpone urination?: No Burning/pain with urination?: No Get up at night to urinate?: No Leakage of urine?: No Urine stream  starts and stops?: No Trouble starting stream?: No Do you have to strain to urinate?: No Blood in urine?: No Urinary tract infection?: No Sexually transmitted disease?: No Injury to kidneys or bladder?: No Painful intercourse?: No Weak stream?: No Currently pregnant?: No Vaginal bleeding?: No Last menstrual period?: n  Gastrointestinal Nausea?: No Vomiting?: No Indigestion/heartburn?: No Diarrhea?: No Constipation?: No  Constitutional Fever: No Night sweats?: No Weight loss?: No Fatigue?: No  Skin Skin rash/lesions?: No Itching?: No  Eyes Blurred vision?: No Double vision?: No  Ears/Nose/Throat Sore throat?: No Sinus problems?: Yes  Hematologic/Lymphatic Swollen glands?: No Easy bruising?: No  Cardiovascular Leg swelling?: No Chest pain?: No  Respiratory Cough?: Yes Shortness of breath?: No  Endocrine Excessive thirst?: No  Musculoskeletal Back pain?: No Joint pain?: No  Neurological Headaches?: No Dizziness?: No  Psychologic Depression?: No Anxiety?: No  Physical Exam: BP (!) 136/53   Pulse 84   Ht 5\' 4"  (1.626 m)   Wt 143 lb (64.9 kg)   BMI 24.55 kg/m   Constitutional: Well nourished. Alert and oriented, No acute distress. HEENT: [place] AT, moist mucus membranes. Trachea midline, no masses. Cardiovascular: No clubbing, cyanosis, or edema. Respiratory: Normal respiratory effort, no increased work of breathing. GI: Abdomen is soft, non tender, non distended, no abdominal masses. Liver and spleen not palpable.  No hernias appreciated.  Stool sample for occult testing is not indicated.   GU: No CVA tenderness.  No bladder fullness or masses.  Atrophic external genitalia, normal pubic hair distribution, no lesions.  Normal urethral meatus, no lesions, no prolapse, no discharge.   No urethral masses, tenderness and/or tenderness. No bladder fullness, tenderness or masses. Pale vagina mucosa, poor estrogen effect, no discharge, no lesions, good  pelvic support, no cystocele or rectocele noted.  Cervix and uterus are surgically absent.  No adnexal/parametria masses or tenderness noted.  Anus and perineum are without rashes or lesions.    Skin: No rashes, bruises or suspicious lesions. Lymph: No cervical or inguinal adenopathy. Neurologic: Grossly intact, no focal deficits, moving all 4 extremities. Psychiatric: Normal mood and affect.  Laboratory Data: Urinalysis: 0-5 WBC's and 0-5 RBC's.  See EPIC.  I have reviewed the labs.  Procedure Patient is placed in stirrups and her urethral meatus and vulva are cleansed with Betadine.  2% Lidocaine jelly was inserted into her urethra.  I then dilated her with Leta Jungling sounds to a 72f without difficultly.  She tolerated the procedure well.  She will return in 4 months.  Assessment & Plan:    1. Urethral stricture Dilation performed today RTC in  4 months for dilation  2. rUTI's Discussed with the patient that the trimethoprim is an antibiotic and is only to be taken once daily and if she feels her urinary symptoms are worsening she needs to contact our office so that we can evaluate her We will also need to discuss at her next visit the cessation of the trimethoprim as she has been on it for over 1 year  3. History of hematuria completed hematuria work up in 01/2016- no worrisome GU findings 0-5 RBC's on today's UA Patient denies any gross hematuria If microscopic hematuria is present at next visit - will need to repeat workup  Return for December for dialation .  Michiel Cowboy, PA-C  [facility] [address] [place], [place] [address] [phone]

## 2018-06-04 NOTE — Progress Notes (Signed)
10:58 AM   Brenda PrestoRachel C Hayden 04/18/1951 [id number]  Referring provider: Elby Beckrummett, Daniel D, NP [address] [place], [place] [address]  Chief Complaint  Patient presents with  . Female Stricture    Dialation    HPI: Patient is a 67 year old Caucasian female with a history of hematuria and urethral stricture who presents today for urethral dilation.    History of hematuria She is not a smoker.   She is not exposed to secondhand smoke.  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  She completed a workup for gross hematuria in August 2017 with CT urogram and cystoscopy. No worrisome GU findings were discovered.  She denies any gross hematuria.  Her UA today 0-5 WBC's and 0-5 RBC's.     Urethral stricture She is complaining of frequency, nocturia and incontinence.  She had her last urethral dilation in September 2019.  She is currently taking Hyophen and trimethoprim for her urinary symptoms.  She had been taking trimethoprim twice daily 2 or 3 times a week.  Patient denies any gross hematuria, dysuria or suprapubic/flank pain.  Patient denies any fevers, chills, nausea or vomiting.   PMH: Past Medical History:  Diagnosis Date  . Asthma   . Glaucoma   . HLD (hyperlipidemia)   . HTN (hypertension)   . Incomplete bladder emptying   . Recurrent UTI   . Urethral stenosis   . Urethral stricture   . Urgency incontinence     Surgical History: Past Surgical History:  Procedure Laterality Date  . ABDOMINAL HYSTERECTOMY    . CATARACT EXTRACTION, BILATERAL    . GANGLION CYST EXCISION    . KNEE SURGERY    . NASAL SINUS SURGERY    . TONSILLECTOMY      Home Medications:  Allergies as of 06/05/2018      Reactions   Nubain [nalbuphine Hcl]    Amlodipine Other (See Comments)   "Muscle pain and weakness"   Atorvastatin Other (See Comments)   Felt bad, loss of appetite   Ciprofloxacin-ciproflox Hcl Er    Fluticasone Other (See Comments)   Heart  palpitations, dizziness    Latex    Nalbuphine Other (See Comments)   Penicillins Nausea And Vomiting   Singulair  [montelukast Sodium] Other (See Comments)      Medication List       Accurate as of June 05, 2018 10:58 AM. Always use your most recent med list.        aspirin 81 MG tablet Take 81 mg by mouth daily.   beclomethasone 80 MCG/ACT inhaler Commonly known as:  QVAR Inhale 2 puffs into the lungs 2 (two) times daily.   hydrochlorothiazide 12.5 MG capsule Commonly known as:  MICROZIDE Take 12.5 mg by mouth daily.   Meth-Hyo-M Bl-Benz Acd-Ph Sal 81.6 MG Tabs Commonly known as:  HYOPHEN Take 1 tablet (81.6 mg total) by mouth QID.   pravastatin 40 MG tablet Commonly known as:  PRAVACHOL Take 40 mg by mouth daily.   Salmon Oil Caps Take 1 capsule by mouth daily.   trimethoprim 100 MG tablet Commonly known as:  TRIMPEX Take 1 tablet (100 mg total) by mouth daily.   vitamin E 1000 UNIT capsule Take 1,000 Units by mouth daily.       Allergies:  Allergies  Allergen Reactions  . Nubain [Nalbuphine Hcl]   . Amlodipine Other (See Comments)    "Muscle pain and weakness"  . Atorvastatin Other (See Comments)  Felt bad, loss of appetite  . Ciprofloxacin-Ciproflox Hcl Er   . Fluticasone Other (See Comments)    Heart palpitations, dizziness   . Latex   . Nalbuphine Other (See Comments)  . Penicillins Nausea And Vomiting  . Singulair  [Montelukast Sodium] Other (See Comments)    Family History: Family History  Problem Relation Age of Onset  . Colon cancer Mother   . Diabetes Father   . Kidney disease Neg Hx   . Prostate cancer Neg Hx   . Bladder Cancer Neg Hx   . Kidney cancer Neg Hx     Social History:  reports that she has never smoked. She has never used smokeless tobacco. She reports that she does not drink alcohol or use drugs.  ROS: UROLOGY Frequent Urination?: No Hard to postpone urination?: No Burning/pain with urination?: No Get up  at night to urinate?: No Leakage of urine?: No Urine stream starts and stops?: No Trouble starting stream?: No Do you have to strain to urinate?: No Blood in urine?: No Urinary tract infection?: No Sexually transmitted disease?: No Injury to kidneys or bladder?: No Painful intercourse?: No Weak stream?: No Currently pregnant?: No Vaginal bleeding?: No Last menstrual period?: n  Gastrointestinal Nausea?: No Vomiting?: No Indigestion/heartburn?: No Diarrhea?: No Constipation?: No  Constitutional Fever: No Night sweats?: No Weight loss?: No Fatigue?: No  Skin Skin rash/lesions?: No Itching?: No  Eyes Blurred vision?: No Double vision?: No  Ears/Nose/Throat Sore throat?: No Sinus problems?: No  Hematologic/Lymphatic Swollen glands?: No Easy bruising?: No  Cardiovascular Leg swelling?: No Chest pain?: No  Respiratory Cough?: Yes Shortness of breath?: No  Endocrine Excessive thirst?: No  Musculoskeletal Back pain?: No Joint pain?: No  Neurological Headaches?: No Dizziness?: No  Psychologic Depression?: No Anxiety?: No  Physical Exam: BP (!) 136/57   Pulse 84   Ht 5\' 4"  (1.626 m)   Wt 143 lb (64.9 kg)   BMI 24.55 kg/m   Constitutional:  Well nourished. Alert and oriented, No acute distress. HEENT: [place] AT, moist mucus membranes.  Trachea midline, no masses. Cardiovascular: No clubbing, cyanosis, or edema. Respiratory: Normal respiratory effort, no increased work of breathing. GI: Abdomen is soft, non tender, non distended, no abdominal masses. Liver and spleen not palpable.  No hernias appreciated.  Stool sample for occult testing is not indicated.   GU: No CVA tenderness.  No bladder fullness or masses.  Atrophic external genitalia, sparse pubic hair distribution, no lesions.  Normal urethral meatus, no lesions, no prolapse, no discharge.   No urethral masses, tenderness and/or tenderness.  Skin: No rashes, bruises or suspicious  lesions. Neurologic: Grossly intact, no focal deficits, moving all 4 extremities. Psychiatric: Normal mood and affect.   Laboratory Data: Urinalysis: 0-5 WBC's and 0-5 RBC's.  See EPIC.  I have reviewed the labs.  Procedure Patient is placed in stirrups and her urethral meatus and vulva are cleansed with Betadine.  2% Lidocaine jelly was inserted into her urethra.  I then dilated her with urethral sounds to a 4556f without difficultly.  She tolerated the procedure well.  She will return in 4 months.  Assessment & Plan:    1. Urethral stricture Dilation performed today RTC in 4 months for dilation  2. rUTI's Discussed with the patient that the trimethoprim is an antibiotic and is only to be taken once daily and if she feels her urinary symptoms are worsening she needs to contact our office so that we can evaluate her We will also  need to discuss at her next visit the cessation of the trimethoprim as she has been on it for over 1 year  3. History of hematuria completed hematuria work up in 01/2016- no worrisome GU findings 0-5 RBC's on today's UA - will pursue a RUS at this time as she has had two previous negative CTU's  Patient denies any gross hematuria She would like to have the renal ultrasound in mid January and will schedule her cysto after this I have explained to the patient that they will  be scheduled for a cystoscopy in our office to evaluate their bladder.  The cystoscopy consists of passing a tube with a lens up through their urethra and into their urinary bladder.   We will inject the urethra with a lidocaine gel prior to introducing the cystoscope to help with any discomfort during the procedure.   After the procedure, they might experience blood in the urine and discomfort with urination.  This will abate after the first few voids.  I have  encouraged the patient to increase water intake  during this time.  Patient denies any allergies to lidocaine.    Return for pending  renal ultrasound report .  Michiel Cowboy, PA-C  [facility] [address] [place], [place] [address] [phone]

## 2018-06-05 ENCOUNTER — Encounter: Payer: Self-pay | Admitting: Urology

## 2018-06-05 ENCOUNTER — Other Ambulatory Visit: Payer: Self-pay

## 2018-06-05 ENCOUNTER — Ambulatory Visit (INDEPENDENT_AMBULATORY_CARE_PROVIDER_SITE_OTHER): Payer: Medicare HMO | Admitting: Urology

## 2018-06-05 ENCOUNTER — Other Ambulatory Visit
Admission: RE | Admit: 2018-06-05 | Discharge: 2018-06-05 | Disposition: A | Discharge status: Home / Self Care | Payer: Medicare HMO | Attending: Urology | Admitting: Urology

## 2018-06-05 VITALS — BP 136/57 | HR 84 | Ht 64.0 in | Wt 143.0 lb

## 2018-06-05 DIAGNOSIS — N390 Urinary tract infection, site not specified: Secondary | ICD-10-CM

## 2018-06-05 DIAGNOSIS — R3129 Other microscopic hematuria: Secondary | ICD-10-CM | POA: Diagnosis not present

## 2018-06-05 DIAGNOSIS — Z87448 Personal history of other diseases of urinary system: Secondary | ICD-10-CM | POA: Diagnosis not present

## 2018-06-05 DIAGNOSIS — N3592 Unspecified urethral stricture, female: Secondary | ICD-10-CM

## 2018-06-05 DIAGNOSIS — Z87440 Personal history of urinary (tract) infections: Secondary | ICD-10-CM | POA: Diagnosis not present

## 2018-06-05 LAB — URINALYSIS, COMPLETE (UACMP) WITH MICROSCOPIC
Bacteria, UA: NONE SEEN
Bilirubin Urine: NEGATIVE
GLUCOSE, UA: NEGATIVE mg/dL
Hgb urine dipstick: NEGATIVE
Leukocytes, UA: NEGATIVE
NITRITE: NEGATIVE
PH: 7 (ref 5.0–8.0)
Protein, ur: NEGATIVE mg/dL
SPECIFIC GRAVITY, URINE: 1.02 (ref 1.005–1.030)

## 2018-06-19 ENCOUNTER — Ambulatory Visit
Admission: RE | Admit: 2018-06-19 | Discharge: 2018-06-19 | Disposition: A | Discharge status: Home / Self Care | Payer: Medicare HMO | Source: Ambulatory Visit | Attending: Urology | Admitting: Urology

## 2018-06-19 DIAGNOSIS — R3129 Other microscopic hematuria: Secondary | ICD-10-CM | POA: Diagnosis present

## 2018-07-14 ENCOUNTER — Ambulatory Visit: Payer: Medicare HMO | Admitting: Urology

## 2018-07-14 ENCOUNTER — Other Ambulatory Visit: Payer: Self-pay

## 2018-07-14 ENCOUNTER — Other Ambulatory Visit
Admission: RE | Admit: 2018-07-14 | Discharge: 2018-07-14 | Disposition: A | Discharge status: Home / Self Care | Payer: Medicare HMO | Attending: Urology | Admitting: Urology

## 2018-07-14 ENCOUNTER — Encounter: Payer: Self-pay | Admitting: Urology

## 2018-07-14 VITALS — BP 118/54 | HR 74 | Ht 63.0 in | Wt 143.0 lb

## 2018-07-14 DIAGNOSIS — N390 Urinary tract infection, site not specified: Secondary | ICD-10-CM | POA: Insufficient documentation

## 2018-07-14 DIAGNOSIS — Z87448 Personal history of other diseases of urinary system: Secondary | ICD-10-CM

## 2018-07-14 DIAGNOSIS — R3129 Other microscopic hematuria: Secondary | ICD-10-CM

## 2018-07-14 LAB — URINALYSIS, COMPLETE (UACMP) WITH MICROSCOPIC
Bacteria, UA: NONE SEEN
Bilirubin Urine: NEGATIVE
Glucose, UA: NEGATIVE mg/dL
Hgb urine dipstick: NEGATIVE
Ketones, ur: NEGATIVE mg/dL
Leukocytes, UA: NEGATIVE
Nitrite: NEGATIVE
Protein, ur: NEGATIVE mg/dL
Specific Gravity, Urine: 1.01 (ref 1.005–1.030)
pH: 6.5 (ref 5.0–8.0)

## 2018-07-14 NOTE — Progress Notes (Signed)
   07/14/18  CC:  Chief Complaint  Patient presents with  . Cysto    HPI: 68 yo F with microscopic hematuria as well as history of urethral stricture requiring dilation who presents today for office cystoscopy.  Notably, she had a hematuria work-up back in 2017 including cystoscopy which was unremarkable as well as CT urogram.  More recently, she had a renal ultrasound on 06/19/2018 which shows slightly larger left kidney than right, otherwise was unremarkable without stones or hydronephrosis.  No masses.  Blood pressure (!) 118/54, pulse 74, height 5\' 3"  (1.6 m), weight 143 lb (64.9 kg). NED. A&Ox3.   No respiratory distress   Abd soft, NT, ND Normal external genitalia with patent urethral meatus  Cystoscopy Procedure Note  Patient identification was confirmed, informed consent was obtained, and patient was prepped using Betadine solution.  Lidocaine jelly was administered per urethral meatus.    Procedure: - Flexible cystoscope introduced, without any difficulty.   - Thorough search of the bladder revealed:    normal urethral meatus    normal urothelium    no stones    no ulcers     no tumors    no urethral polyps    no trabeculation  - Ureteral orifices were normal in position and appearance.  Post-Procedure: - Patient tolerated the procedure well  Assessment/ Plan:  1. Microscopic hematuria Cystoscopy today is unremarkable Reviewed renal ultrasound results with patient  2. H/O urethral stricture Personal history of urethral stricture which was not appreciated today Patient is quite adamant about requiring urethral dilation, will defer this discussion further today    Return in about 6 months (around 01/12/2019) for shannon recheck.  Vanna Scotland, MD

## 2019-01-08 ENCOUNTER — Ambulatory Visit: Payer: Medicare HMO | Admitting: Urology

## 2019-02-05 ENCOUNTER — Ambulatory Visit: Payer: Medicare HMO | Admitting: Urology

## 2019-05-18 ENCOUNTER — Other Ambulatory Visit: Payer: Self-pay

## 2019-05-18 DIAGNOSIS — R3129 Other microscopic hematuria: Secondary | ICD-10-CM

## 2019-05-20 NOTE — Progress Notes (Signed)
10:50 AM   Brenda Hayden 01/13/1951 [id number]  Referring provider: Maudie Mercuryrummett, Lysle Rubensaniel D, NP No address on file  Chief Complaint  Patient presents with  . Urethral Stricture    dialation    HPI: Patient is a 68 year old Caucasian female with a history of hematuria and urethral stricture who presents today for urethral dilation.    History of hematuria She is not a smoker.   She is not exposed to secondhand smoke.  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  She completed a workup for gross hematuria in August 2017 with CT urogram and cystoscopy. No worrisome GU findings were discovered.  RUS in 06/2018 No hydronephrosis identified bilaterally. The left kidney larger than the right as above.  Cysto in 06/2018 with Dr. Apolinar JunesBrandon was NED.    Urethral stricture She is having some urgency and frequency.  Her UA is clear.  Patient denies any gross hematuria, dysuria or suprapubic/flank pain.  Patient denies any fevers, chills, nausea or vomiting.   PMH: Past Medical History:  Diagnosis Date  . Asthma   . Glaucoma   . HLD (hyperlipidemia)   . HTN (hypertension)   . Incomplete bladder emptying   . Recurrent UTI   . Urethral stenosis   . Urethral stricture   . Urgency incontinence     Surgical History: Past Surgical History:  Procedure Laterality Date  . ABDOMINAL HYSTERECTOMY    . CATARACT EXTRACTION, BILATERAL    . GANGLION CYST EXCISION    . KNEE SURGERY    . NASAL SINUS SURGERY    . TONSILLECTOMY      Home Medications:  Allergies as of 05/21/2019      Reactions   Nubain [nalbuphine Hcl]    Amlodipine Other (See Comments)   "Muscle pain and weakness"   Atorvastatin Other (See Comments)   Felt bad, loss of appetite   Ciprofloxacin-ciproflox Hcl Er    Fluticasone Other (See Comments)   Heart palpitations, dizziness    Latex    Nalbuphine Other (See Comments)   Penicillins Nausea And Vomiting   Singulair  [montelukast Sodium] Other (See Comments)       Medication List       Accurate as of May 21, 2019 10:50 AM. If you have any questions, ask your nurse or doctor.        aspirin 81 MG tablet Take 81 mg by mouth daily.   beclomethasone 80 MCG/ACT inhaler Commonly known as: QVAR Inhale 2 puffs into the lungs 2 (two) times daily.   hydrochlorothiazide 12.5 MG capsule Commonly known as: MICROZIDE Take 12.5 mg by mouth daily.   Hyophen 81.6 MG Tabs Generic drug: Meth-Hyo-M Bl-Benz Acd-Ph Sal Take 1 tablet (81.6 mg total) by mouth QID.   losartan 50 MG tablet Commonly known as: COZAAR Take 50 mg by mouth daily.   pravastatin 40 MG tablet Commonly known as: PRAVACHOL Take 40 mg by mouth daily.   Salmon Oil Caps Take 1 capsule by mouth daily.   trimethoprim 100 MG tablet Commonly known as: TRIMPEX Take 1 tablet (100 mg total) by mouth daily.   vitamin E 1000 UNIT capsule Take 1,000 Units by mouth daily.       Allergies:  Allergies  Allergen Reactions  . Nubain [Nalbuphine Hcl]   . Amlodipine Other (See Comments)    "Muscle pain and weakness"  . Atorvastatin Other (See Comments)    Felt bad, loss of appetite  . Ciprofloxacin-Ciproflox Hcl Er   .  Fluticasone Other (See Comments)    Heart palpitations, dizziness   . Latex   . Nalbuphine Other (See Comments)  . Penicillins Nausea And Vomiting  . Singulair  [Montelukast Sodium] Other (See Comments)    Family History: Family History  Problem Relation Age of Onset  . Colon cancer Mother   . Diabetes Father   . Kidney disease Neg Hx   . Prostate cancer Neg Hx   . Bladder Cancer Neg Hx   . Kidney cancer Neg Hx     Social History:  reports that she has never smoked. She has never used smokeless tobacco. She reports that she does not drink alcohol or use drugs.  ROS: UROLOGY Frequent Urination?: No Hard to postpone urination?: No Burning/pain with urination?: No Get up at night to urinate?: No Leakage of urine?: No Urine stream starts and stops?:  No Trouble starting stream?: No Do you have to strain to urinate?: No Blood in urine?: No Urinary tract infection?: No Sexually transmitted disease?: No Injury to kidneys or bladder?: No Painful intercourse?: No Weak stream?: No Currently pregnant?: No Vaginal bleeding?: No Last menstrual period?: n  Gastrointestinal Nausea?: No Vomiting?: No Indigestion/heartburn?: No Diarrhea?: No Constipation?: No  Constitutional Fever: No Night sweats?: No Weight loss?: No Fatigue?: No  Skin Skin rash/lesions?: No Itching?: No  Eyes Blurred vision?: No Double vision?: No  Ears/Nose/Throat Sore throat?: No Sinus problems?: No  Hematologic/Lymphatic Swollen glands?: No Easy bruising?: No  Cardiovascular Leg swelling?: No Chest pain?: No  Respiratory Cough?: No Shortness of breath?: No  Endocrine Excessive thirst?: No  Musculoskeletal Back pain?: No Joint pain?: No  Neurological Headaches?: No Dizziness?: No  Psychologic Depression?: No Anxiety?: No  Physical Exam: BP (!) 143/57   Pulse 83   Ht 5\' 3"  (1.6 m)   Wt 140 lb (63.5 kg)   BMI 24.80 kg/m   Constitutional:  Well nourished. Alert and oriented, No acute distress. HEENT: [place] AT, moist mucus membranes.  Trachea midline, no masses. Cardiovascular: No clubbing, cyanosis, or edema. Respiratory: Normal respiratory effort, no increased work of breathing. GI: Abdomen is soft, non tender, non distended, no abdominal masses. Liver and spleen not palpable.  No hernias appreciated.  Stool sample for occult testing is not indicated.   GU: No CVA tenderness.  No bladder fullness or masses.  Atrophic external genitalia, sparse pubic hair distribution, no lesions.  Normal urethral meatus, no lesions, no prolapse, no discharge.   No urethral masses, tenderness and/or tenderness. No bladder fullness, tenderness or masses. Pale vagina mucosa, poor estrogen effect, no discharge, no lesions, fair pelvic support, grade II  cystocele and no rectocele noted.   Neurologic: Grossly intact, no focal deficits, moving all 4 extremities. Psychiatric: Normal mood and affect.   Laboratory Data: Urinalysis: Component     Latest Ref Rng & Units 05/21/2019  Color, Urine     YELLOW GREEN (A)  Appearance     CLEAR CLEAR  Specific Gravity, Urine     1.005 - 1.030 1.020  pH     5.0 - 8.0 7.0  Glucose, UA     NEGATIVE mg/dL NEGATIVE  Hgb urine dipstick     NEGATIVE NEGATIVE  Bilirubin Urine     NEGATIVE NEGATIVE  Ketones, ur     NEGATIVE mg/dL NEGATIVE  Protein     NEGATIVE mg/dL NEGATIVE  Nitrite     NEGATIVE NEGATIVE  Leukocytes,Ua     NEGATIVE NEGATIVE  Squamous Epithelial / LPF  0 - 5 0-5  WBC, UA     0 - 5 WBC/hpf 0-5  RBC / HPF     0 - 5 RBC/hpf NONE SEEN  Bacteria, UA     NONE SEEN FEW (A)    I have reviewed the labs.  Procedure Patient is placed in stirrups and her urethral meatus and vulva are cleansed with Betadine.  2% Lidocaine jelly was inserted into her urethra.  I then dilated her with Elby Showers sounds to a 53f without difficultly.  She tolerated the procedure well.  She will return as needed.    Assessment & Plan:    1. Urethral stricture Dilation performed today - patient tolerated it well  2. rUTI's Asymptomatic at this visit. Not taking the trimethoprim daily  3. History of hematuria Hematuria work up completed in 06/2018 - findings positive for NED No report of gross hematuria  UA today negative  RTC in one year for UA - patient to report any gross hematuria in the interim    Return if symptoms worsen or fail to improve.  Zara Council, PA-C  [facility] [address] [place], [place] [address] [phone]

## 2019-05-21 ENCOUNTER — Encounter: Payer: Self-pay | Admitting: Urology

## 2019-05-21 ENCOUNTER — Other Ambulatory Visit: Payer: Self-pay

## 2019-05-21 ENCOUNTER — Ambulatory Visit: Payer: Medicare HMO | Admitting: Urology

## 2019-05-21 ENCOUNTER — Other Ambulatory Visit
Admission: RE | Admit: 2019-05-21 | Discharge: 2019-05-21 | Disposition: A | Discharge status: Home / Self Care | Payer: Medicare HMO | Attending: Urology | Admitting: Urology

## 2019-05-21 VITALS — BP 143/57 | HR 83 | Ht 63.0 in | Wt 140.0 lb

## 2019-05-21 DIAGNOSIS — N390 Urinary tract infection, site not specified: Secondary | ICD-10-CM | POA: Diagnosis not present

## 2019-05-21 DIAGNOSIS — R3129 Other microscopic hematuria: Secondary | ICD-10-CM | POA: Diagnosis present

## 2019-05-21 DIAGNOSIS — Z87448 Personal history of other diseases of urinary system: Secondary | ICD-10-CM

## 2019-05-21 LAB — URINALYSIS, COMPLETE (UACMP) WITH MICROSCOPIC
Bilirubin Urine: NEGATIVE
Glucose, UA: NEGATIVE mg/dL
Hgb urine dipstick: NEGATIVE
Ketones, ur: NEGATIVE mg/dL
Leukocytes,Ua: NEGATIVE
Nitrite: NEGATIVE
Protein, ur: NEGATIVE mg/dL
RBC / HPF: NONE SEEN RBC/hpf (ref 0–5)
Specific Gravity, Urine: 1.02 (ref 1.005–1.030)
pH: 7 (ref 5.0–8.0)

## 2019-05-21 MED ORDER — HYOPHEN 81.6 MG PO TABS: 81.6000 mg | ORAL_TABLET | Freq: Four times a day (QID) | ORAL | 3 refills | Status: DC

## 2019-07-30 ENCOUNTER — Other Ambulatory Visit: Payer: Self-pay | Admitting: Family Medicine

## 2019-07-30 DIAGNOSIS — Z87448 Personal history of other diseases of urinary system: Secondary | ICD-10-CM

## 2019-07-30 DIAGNOSIS — N390 Urinary tract infection, site not specified: Secondary | ICD-10-CM

## 2019-08-06 ENCOUNTER — Other Ambulatory Visit: Payer: Self-pay

## 2019-08-06 ENCOUNTER — Other Ambulatory Visit
Admission: RE | Admit: 2019-08-06 | Discharge: 2019-08-06 | Disposition: A | Discharge status: Home / Self Care | Payer: Medicare HMO | Attending: Urology | Admitting: Urology

## 2019-08-06 ENCOUNTER — Ambulatory Visit (INDEPENDENT_AMBULATORY_CARE_PROVIDER_SITE_OTHER): Payer: Medicare HMO | Admitting: Urology

## 2019-08-06 ENCOUNTER — Encounter: Payer: Self-pay | Admitting: Urology

## 2019-08-06 VITALS — BP 113/56 | HR 80 | Ht 63.0 in | Wt 135.0 lb

## 2019-08-06 DIAGNOSIS — N3592 Unspecified urethral stricture, female: Secondary | ICD-10-CM | POA: Diagnosis not present

## 2019-08-06 DIAGNOSIS — Z87448 Personal history of other diseases of urinary system: Secondary | ICD-10-CM

## 2019-08-06 DIAGNOSIS — N390 Urinary tract infection, site not specified: Secondary | ICD-10-CM | POA: Insufficient documentation

## 2019-08-06 LAB — URINALYSIS, COMPLETE (UACMP) WITH MICROSCOPIC
Bacteria, UA: NONE SEEN
Bilirubin Urine: NEGATIVE
Glucose, UA: NEGATIVE mg/dL
Hgb urine dipstick: NEGATIVE
Ketones, ur: NEGATIVE mg/dL
Leukocytes,Ua: NEGATIVE
Nitrite: NEGATIVE
Protein, ur: NEGATIVE mg/dL
Specific Gravity, Urine: 1.015 (ref 1.005–1.030)
pH: 6 (ref 5.0–8.0)

## 2019-08-18 NOTE — Progress Notes (Signed)
3:37 PM   Brenda Hayden 11/30/1950 [id number]  Referring provider: [name], NP No address on file  Chief Complaint  Patient presents with  . Urethral Stricture    HPI: Patient is a 69 year old female with a history of hematuria and urethral stricture who presents today for urethral dilation.    History of hematuria (high risk) She is not a smoker.   She completed a workup for gross hematuria in August 2017 with CT urogram and cystoscopy.  No worrisome GU findings were discovered.  RUS in 06/2018 No hydronephrosis identified bilaterally. The left kidney larger than the right as above.  Cysto in 06/2018 with Dr. Apolinar Junes was NED.  No reports of gross hematuria.  UA negative for microscopic hematuria.   Urethral stricture She [place] and plans to stay for several months.  She would like to be dilated today as she will miss her routine appointment.  Patient denies any modifying or aggravating factors.  Patient denies any gross hematuria, dysuria or suprapubic/flank pain.  Patient denies any fevers, chills, nausea or vomiting.   UA green clear, specific gravity 1.015, pH 6.0, 0-5 squamous epithelial cells, 0-5 WBC;s, 0-5 RBC's.   PMH: Past Medical History:  Diagnosis Date  . Asthma   . Glaucoma   . HLD (hyperlipidemia)   . HTN (hypertension)   . Incomplete bladder emptying   . Recurrent UTI   . Urethral stenosis   . Urethral stricture   . Urgency incontinence     Surgical History: Past Surgical History:  Procedure Laterality Date  . ABDOMINAL HYSTERECTOMY    . CATARACT EXTRACTION, BILATERAL    . GANGLION CYST EXCISION    . KNEE SURGERY    . NASAL SINUS SURGERY    . TONSILLECTOMY      Home Medications:  Allergies as of 08/06/2019      Reactions   Nubain [nalbuphine Hcl]    Amlodipine Other (See Comments)   "Muscle pain and weakness"   Atorvastatin Other (See Comments)   Felt bad, loss of appetite   Ciprofloxacin-ciproflox Hcl Er     Fluticasone Other (See Comments)   Heart palpitations, dizziness    Latex    Losartan Other (See Comments)   Fatigue   Nalbuphine Other (See Comments)   Penicillins Nausea And Vomiting   Singulair  [montelukast Sodium] Other (See Comments)      Medication List       Accurate as of August 06, 2019 11:59 PM. If you have any questions, ask your nurse or doctor.        aspirin 81 MG tablet Take 81 mg by mouth daily.   beclomethasone 80 MCG/ACT inhaler Commonly known as: QVAR Inhale 2 puffs into the lungs 2 (two) times daily.   candesartan 4 MG tablet Commonly known as: ATACAND Take 4 mg by mouth daily.   hydrochlorothiazide 12.5 MG capsule Commonly known as: MICROZIDE Take 12.5 mg by mouth daily.   Hyophen 81.6 MG Tabs Generic drug: Meth-Hyo-M Bl-Benz Acd-Ph Sal Take 1 tablet (81.6 mg total) by mouth QID.   losartan 50 MG tablet Commonly known as: COZAAR Take 50 mg by mouth daily.   potassium chloride 10 MEQ tablet Commonly known as: KLOR-CON Take 10 mEq by mouth 2 (two) times daily.   pravastatin 40 MG tablet Commonly known as: PRAVACHOL Take 40 mg by mouth daily.   Salmon Oil Caps Take 1 capsule by mouth daily.  trimethoprim 100 MG tablet Commonly known as: TRIMPEX Take 1 tablet (100 mg total) by mouth daily.   vitamin E 1000 UNIT capsule Take 1,000 Units by mouth daily.       Allergies:  Allergies  Allergen Reactions  . Nubain [Nalbuphine Hcl]   . Amlodipine Other (See Comments)    "Muscle pain and weakness"  . Atorvastatin Other (See Comments)    Felt bad, loss of appetite  . Ciprofloxacin-Ciproflox Hcl Er   . Fluticasone Other (See Comments)    Heart palpitations, dizziness   . Latex   . Losartan Other (See Comments)    Fatigue  . Nalbuphine Other (See Comments)  . Penicillins Nausea And Vomiting  . Singulair  [Montelukast Sodium] Other (See Comments)    Family History: Family History  Problem Relation Age of Onset  . Colon  cancer Mother   . Diabetes Father   . Kidney disease Neg Hx   . Prostate cancer Neg Hx   . Bladder Cancer Neg Hx   . Kidney cancer Neg Hx     Social History:  reports that she has never smoked. She has never used smokeless tobacco. She reports that she does not drink alcohol or use drugs.  ROS: For pertinent review of systems please refer to history of present illness  Physical Exam: BP (!) 113/56   Pulse 80   Ht 5\' 3"  (1.6 m)   Wt 135 lb (61.2 kg)   BMI 23.91 kg/m   Constitutional:  Well nourished. Alert and oriented, No acute distress. HEENT: [place] AT, mask in place.  Trachea midline, no masses. Cardiovascular: No clubbing, cyanosis, or edema. Respiratory: Normal respiratory effort, no increased work of breathing. GI: Abdomen is soft, non tender, non distended, no abdominal masses.  GU: No CVA tenderness.  No bladder fullness or masses.  Atrophic external genitalia, sparse pubic hair distribution, no lesions.  Normal urethral meatus, no lesions, no prolapse, no discharge.   No urethral masses, tenderness and/or tenderness. No bladder fullness, tenderness or masses. Pale vagina mucosa, poor estrogen effect, no discharge, no lesions, fair pelvic support, grade II cystocele and no rectocele noted.   Anus and perineum are without rashes or lesions.    Skin: No rashes, bruises or suspicious lesions. Lymph: No inguinal adenopathy. Neurologic: Grossly intact, no focal deficits, moving all 4 extremities. Psychiatric: Normal mood and affect.   Laboratory Data: Urinalysis: Component     Latest Ref Rng & Units 08/06/2019  Color, Urine     YELLOW GREEN (A)  Appearance     CLEAR CLEAR  Specific Gravity, Urine     1.005 - 1.030 1.015  pH     5.0 - 8.0 6.0  Glucose, UA     NEGATIVE mg/dL NEGATIVE  Hgb urine dipstick     NEGATIVE NEGATIVE  Bilirubin Urine     NEGATIVE NEGATIVE  Ketones, ur     NEGATIVE mg/dL NEGATIVE  Protein     NEGATIVE mg/dL NEGATIVE  Nitrite     NEGATIVE  NEGATIVE  Leukocytes,Ua     NEGATIVE NEGATIVE  Squamous Epithelial / LPF     0 - 5 0-5  WBC, UA     0 - 5 WBC/hpf 0-5  RBC / HPF     0 - 5 RBC/hpf 0-5  Bacteria, UA     NONE SEEN NONE SEEN   I have reviewed the labs.  Procedure Patient is placed in stirrups and her urethral meatus and vulva are cleansed with  Betadine.  2% Lidocaine jelly was inserted into her urethra.  I then dilated her with Leta Jungling sounds to a 50f without difficultly.  She tolerated the procedure well.  She will return as needed.    Assessment & Plan:    1. Urethral stricture Dilation performed today - patient tolerated it well  2. rUTI's Asymptomatic at this visit. Not taking the trimethoprim daily  3. History of hematuria Hematuria work up completed in 06/2018 - findings positive for NED No report of gross hematuria  UA today negative  RTC in one year for UA - patient to report any gross hematuria in the interim    Return for Patient will call for return appointment .  Michiel Cowboy, PA-C  [facility] [address] [place], [place] [address] [phone]

## 2020-05-25 NOTE — Progress Notes (Signed)
11:05 AM   Brenda Hayden 20-Dec-1950 [id number]  Referring provider: Leslye Peer, PA [address] [facility] [place],  [place] [address]  Chief Complaint  Patient presents with  . Follow-up    dilation    HPI: Patient is a 69 year old female with an urological history of high risk hematuria (negative work up) and urethral stricture who presents today for urethral dilation.    High risk hematuria Non smoker.  She completed a workup for gross hematuria in August 2017 with CT urogram and cystoscopy.  No worrisome GU findings were discovered.  RUS in 06/2018 No hydronephrosis identified bilaterally. The left kidney larger than the right as above.  Cysto in 06/2018 with Dr. Apolinar Junes was NED.   She does not report any gross hematuria.  Her UA today is negative for micro heme.    Urethral stricture She is not having urinary issues at this time.  She is still wanting to undergo dilation as she is fearful her symptoms will return.  Patient denies any modifying or aggravating factors.  Patient denies any gross hematuria, dysuria or suprapubic/flank pain.  Patient denies any fevers, chills, nausea or vomiting.   PMH: Past Medical History:  Diagnosis Date  . Asthma   . Glaucoma   . HLD (hyperlipidemia)   . HTN (hypertension)   . Incomplete bladder emptying   . Recurrent UTI   . Urethral stenosis   . Urethral stricture   . Urgency incontinence     Surgical History: Past Surgical History:  Procedure Laterality Date  . ABDOMINAL HYSTERECTOMY    . CATARACT EXTRACTION, BILATERAL    . GANGLION CYST EXCISION    . KNEE SURGERY    . NASAL SINUS SURGERY    . TONSILLECTOMY      Home Medications:  Allergies as of 05/26/2020      Reactions   Nubain [nalbuphine Hcl]    Amlodipine Other (See Comments)   "Muscle pain and weakness"   Atorvastatin Other (See Comments)   Felt bad, loss of appetite   Ciprofloxacin-ciproflox Hcl Er    Fluticasone Other (See  Comments)   Heart palpitations, dizziness    Latex    Losartan Other (See Comments)   Fatigue   Nalbuphine Other (See Comments)   Penicillins Nausea And Vomiting   Singulair  [montelukast Sodium] Other (See Comments)      Medication List       Accurate as of May 26, 2020 11:05 AM. If you have any questions, ask your nurse or doctor.        aspirin 81 MG tablet Take 81 mg by mouth daily.   beclomethasone 80 MCG/ACT inhaler Commonly known as: QVAR Inhale 2 puffs into the lungs 2 (two) times daily.   candesartan 4 MG tablet Commonly known as: ATACAND Take 4 mg by mouth daily.   hydrochlorothiazide 25 MG tablet Commonly known as: HYDRODIURIL Take 25 mg by mouth daily. What changed: Another medication with the same name was removed. Continue taking this medication, and follow the directions you see here. Changed by: Neizan Debruhl, PA-C   Hyophen 81.6 MG Tabs Generic drug: Meth-Hyo-M Bl-Benz Acd-Ph Sal Take 1 tablet (81.6 mg total) by mouth QID.   losartan 50 MG tablet Commonly known as: COZAAR Take 50 mg by mouth daily.   potassium chloride 10 MEQ tablet Commonly known as: KLOR-CON Take 10 mEq by mouth 2 (two) times daily.   pravastatin 40 MG tablet Commonly  known as: PRAVACHOL Take 40 mg by mouth daily.   Salmon Oil Caps Take 1 capsule by mouth daily.   trimethoprim 100 MG tablet Commonly known as: TRIMPEX Take 1 tablet (100 mg total) by mouth daily.   vitamin E 1000 UNIT capsule Take 1,000 Units by mouth daily.       Allergies:  Allergies  Allergen Reactions  . Nubain [Nalbuphine Hcl]   . Amlodipine Other (See Comments)    "Muscle pain and weakness"  . Atorvastatin Other (See Comments)    Felt bad, loss of appetite  . Ciprofloxacin-Ciproflox Hcl Er   . Fluticasone Other (See Comments)    Heart palpitations, dizziness   . Latex   . Losartan Other (See Comments)    Fatigue  . Nalbuphine Other (See Comments)  . Penicillins Nausea And  Vomiting  . Singulair  [Montelukast Sodium] Other (See Comments)    Family History: Family History  Problem Relation Age of Onset  . Colon cancer Mother   . Diabetes Father   . Kidney disease Neg Hx   . Prostate cancer Neg Hx   . Bladder Cancer Neg Hx   . Kidney cancer Neg Hx     Social History:  reports that she has never smoked. She has never used smokeless tobacco. She reports that she does not drink alcohol and does not use drugs.  ROS: For pertinent review of systems please refer to history of present illness  Physical Exam: BP (!) 159/69   Pulse 74   Ht 5\' 3"  (1.6 m)   Wt 134 lb (60.8 kg)   BMI 23.74 kg/m   Constitutional:  Well nourished. Alert and oriented, No acute distress. HEENT: [place] AT, mask in place.  Trachea midline, no masses. Cardiovascular: No clubbing, cyanosis, or edema. Respiratory: Normal respiratory effort, no increased work of breathing. GU: No CVA tenderness.  No bladder fullness or masses.  Atrophic external genitalia, sparse pubic hair distribution, no lesions.  Normal urethral meatus, no lesions, no prolapse, no discharge.   No urethral masses, tenderness and/or tenderness. No bladder fullness, tenderness or masses. Pale vagina mucosa, poor estrogen effect, no discharge, no lesions.  Anus and perineum are without rashes or lesions.    Neurologic: Grossly intact, no focal deficits, moving all 4 extremities. Psychiatric: Normal mood and affect.   Laboratory Data: Urinalysis: Component     Latest Ref Rng & Units 05/26/2020  Color, Urine     YELLOW GREEN (A)  Appearance     CLEAR CLEAR  Specific Gravity, Urine     1.005 - 1.030 1.020  pH     5.0 - 8.0 7.0  Glucose, UA     NEGATIVE mg/dL NEGATIVE  Hgb urine dipstick     NEGATIVE NEGATIVE  Bilirubin Urine     NEGATIVE NEGATIVE  Ketones, ur     NEGATIVE mg/dL NEGATIVE  Protein     NEGATIVE mg/dL NEGATIVE  Nitrite     NEGATIVE NEGATIVE  Leukocytes,Ua     NEGATIVE NEGATIVE  Squamous  Epithelial / LPF     0 - 5 0-5  WBC, UA     0 - 5 WBC/hpf 0-5  RBC / HPF     0 - 5 RBC/hpf 0-5  Bacteria, UA     NONE SEEN FEW (A)   I have reviewed the labs.  Procedure Patient is placed in stirrups and her urethral meatus and vulva are cleansed with Hibiclens.  2% Lidocaine jelly was inserted into her urethra.  I then dilated her with Leta Jungling sounds to a 62f without difficultly.  She tolerated the procedure well.   Assessment & Plan:    1. Urethral stricture Dilation performed today - patient tolerated it well Discussed with patient that urethral dilation has fallen out of favor and that we typically do not continue to dilate.  She became very anxious and worried about not continuing her dilations, so we will continue to dilate.    2. rUTI's Asymptomatic at this visit. Not taking the trimethoprim daily  3. History of hematuria Hematuria work up completed in 06/2018 - findings positive for NED No report of gross hematuria UA today is negative for micro heme RTC in one year for UA - patient to report any gross hematuria in the interim    Return for urethral dilation sometime after July 4th .  Michiel Cowboy, PA-C  [facility] [address] [place], [place] [address] [phone]

## 2020-05-26 ENCOUNTER — Other Ambulatory Visit: Payer: Self-pay | Admitting: *Deleted

## 2020-05-26 ENCOUNTER — Ambulatory Visit: Payer: Medicare HMO | Admitting: Urology

## 2020-05-26 ENCOUNTER — Other Ambulatory Visit: Payer: Self-pay

## 2020-05-26 ENCOUNTER — Other Ambulatory Visit
Admission: RE | Admit: 2020-05-26 | Discharge: 2020-05-26 | Disposition: A | Discharge status: Home / Self Care | Payer: Medicare HMO | Attending: Urology | Admitting: Urology

## 2020-05-26 ENCOUNTER — Encounter: Payer: Self-pay | Admitting: Urology

## 2020-05-26 VITALS — BP 159/69 | HR 74 | Ht 63.0 in | Wt 134.0 lb

## 2020-05-26 DIAGNOSIS — N3592 Unspecified urethral stricture, female: Secondary | ICD-10-CM | POA: Diagnosis not present

## 2020-05-26 DIAGNOSIS — Z87448 Personal history of other diseases of urinary system: Secondary | ICD-10-CM | POA: Diagnosis present

## 2020-05-26 DIAGNOSIS — R319 Hematuria, unspecified: Secondary | ICD-10-CM | POA: Diagnosis not present

## 2020-05-26 DIAGNOSIS — N952 Postmenopausal atrophic vaginitis: Secondary | ICD-10-CM | POA: Diagnosis not present

## 2020-05-26 LAB — URINALYSIS, COMPLETE (UACMP) WITH MICROSCOPIC
Bilirubin Urine: NEGATIVE
Glucose, UA: NEGATIVE mg/dL
Hgb urine dipstick: NEGATIVE
Ketones, ur: NEGATIVE mg/dL
Leukocytes,Ua: NEGATIVE
Nitrite: NEGATIVE
Protein, ur: NEGATIVE mg/dL
Specific Gravity, Urine: 1.02 (ref 1.005–1.030)
pH: 7 (ref 5.0–8.0)

## 2020-12-22 ENCOUNTER — Ambulatory Visit: Payer: Medicare HMO | Admitting: Urology

## 2021-01-04 NOTE — Progress Notes (Signed)
10:54 AM   Brenda Hayden 1951/04/04 [id number]  Referring provider: Leslye Peer, PA [address] [facility] [place],  [place] [address]  Urological history: 1. High risk hematuria -non-smoker -CTU 2017 NED -cysto 2017 NED -RUS 2020 NED -cysto 2020 NED -no report of gross hematuria -UA negative for micro heme  2. Urethral stricture -managed with urethral dilation every 6 months and Hyophen   Chief Complaint  Patient presents with   Follow-up     HPI: Brenda Hayden is a 70 y.o. female who presents today for 6 month follow up.  She states it has been more difficult to urinate lately.  Patient denies any modifying or aggravating factors.  Patient denies any gross hematuria, dysuria or suprapubic/flank pain.  Patient denies any fevers, chills, nausea or vomiting.    She needs a refill on her Hyophen.    PMH: Past Medical History:  Diagnosis Date   Asthma    Glaucoma    HLD (hyperlipidemia)    HTN (hypertension)    Incomplete bladder emptying    Recurrent UTI    Urethral stenosis    Urethral stricture    Urgency incontinence     Surgical History: Past Surgical History:  Procedure Laterality Date   ABDOMINAL HYSTERECTOMY     CATARACT EXTRACTION, BILATERAL     GANGLION CYST EXCISION     KNEE SURGERY     NASAL SINUS SURGERY     TONSILLECTOMY      Home Medications:  Allergies as of 01/05/2021       Reactions   Nubain [nalbuphine Hcl]    Amlodipine Other (See Comments)   "Muscle pain and weakness"   Atorvastatin Other (See Comments)   Felt bad, loss of appetite   Ciprofloxacin-ciproflox Hcl Er    Fluticasone Other (See Comments)   Heart palpitations, dizziness    Latex    Losartan Other (See Comments)   Fatigue   Nalbuphine Other (See Comments)   Penicillins Nausea And Vomiting   Singulair  [montelukast Sodium] Other (See Comments)        Medication List        Accurate as of January 05, 2021 10:54 AM. If  you have any questions, ask your nurse or doctor.          STOP taking these medications    aspirin 81 MG tablet Stopped by: Overton Boggus, PA-C   losartan 50 MG tablet Commonly known as: COZAAR Stopped by: Devann Cribb, PA-C   potassium chloride 10 MEQ tablet Commonly known as: KLOR-CON Stopped by: Jaylise Peek, PA-C   trimethoprim 100 MG tablet Commonly known as: TRIMPEX Stopped by: Michiel Cowboy, PA-C       TAKE these medications    beclomethasone 80 MCG/ACT inhaler Commonly known as: QVAR Inhale 2 puffs into the lungs 2 (two) times daily.   candesartan 4 MG tablet Commonly known as: ATACAND Take 4 mg by mouth daily.   hydrochlorothiazide 25 MG tablet Commonly known as: HYDRODIURIL Take 25 mg by mouth daily.   Hyophen 81.6 MG Tabs Generic drug: Meth-Hyo-M Bl-Benz Acd-Ph Sal Take 1 tablet (81.6 mg total) by mouth QID.   pravastatin 40 MG tablet Commonly known as: PRAVACHOL Take 40 mg by mouth daily.   Salmon Oil Caps Take 1 capsule by mouth daily.   vitamin E 1000 UNIT capsule Take 1,000 Units by mouth daily.        Allergies:  Allergies  Allergen Reactions  Nubain [Nalbuphine Hcl]    Amlodipine Other (See Comments)    "Muscle pain and weakness"   Atorvastatin Other (See Comments)    Felt bad, loss of appetite   Ciprofloxacin-Ciproflox Hcl Er    Fluticasone Other (See Comments)    Heart palpitations, dizziness    Latex    Losartan Other (See Comments)    Fatigue   Nalbuphine Other (See Comments)   Penicillins Nausea And Vomiting   Singulair  [Montelukast Sodium] Other (See Comments)    Family History: Family History  Problem Relation Age of Onset   Colon cancer Mother    Diabetes Father    Kidney disease Neg Hx    Prostate cancer Neg Hx    Bladder Cancer Neg Hx    Kidney cancer Neg Hx     Social History:  reports that she has never smoked. She has never used smokeless tobacco. She reports that she does not drink  alcohol and does not use drugs.  ROS: For pertinent review of systems please refer to history of present illness  Physical Exam: BP 133/76   Pulse 77   Ht 5\' 3"  (1.6 m)   Wt 139 lb (63 kg)   BMI 24.62 kg/m   Constitutional:  Well nourished. Alert and oriented, No acute distress. HEENT: [place] AT, mask in place  Trachea midline Cardiovascular: No clubbing, cyanosis, or edema. Respiratory: Normal respiratory effort, no increased work of breathing. GU: No CVA tenderness.  No bladder fullness or masses.  Atrophic external genitalia, normal pubic hair distribution, no lesions.  Normal urethral meatus, no lesions, no prolapse, no discharge.    Neurologic: Grossly intact, no focal deficits, moving all 4 extremities. Psychiatric: Normal mood and affect.    Laboratory Data: Hemoglobin A1C <6.5 % 5.4   Average Blood Glucose (Calculated From HgBA1c Level) mg/dL   Resulting Agency  [facility]   Narrative Performed by [facility] POC TEST(S) ABOVE PERFORMED AT THE PATIENT CARE LOCATION AND OVERSEEN BY [facility] POCT PROGRAM. Specimen Collected: 12/11/20 08:13 Last Resulted: 12/11/20 08:30  [facility]  Result Received: 01/04/21 14:26   Urinalysis: Component     Latest Ref Rng & Units 01/05/2021  Color, Urine     YELLOW GREEN (A)  Appearance     CLEAR HAZY (A)  Specific Gravity, Urine     1.005 - 1.030 1.015  pH     5.0 - 8.0 6.0  Glucose, UA     NEGATIVE mg/dL NEGATIVE  Hgb urine dipstick     NEGATIVE NEGATIVE  Bilirubin Urine     NEGATIVE NEGATIVE  Ketones, ur     NEGATIVE mg/dL NEGATIVE  Protein     NEGATIVE mg/dL NEGATIVE  Nitrite     NEGATIVE NEGATIVE  Leukocytes,Ua     NEGATIVE NEGATIVE  Squamous Epithelial / LPF     0 - 5 0-5  WBC, UA     0 - 5 WBC/hpf 0-5  RBC / HPF     0 - 5 RBC/hpf 0-5  Bacteria, UA     NONE SEEN RARE (A)  I have reviewed the labs.  Procedure Patient is placed in stirrups  and her urethral meatus and vulva are cleansed with Betadine.  2% Lidocaine jelly was inserted into her urethra.  I then dilated her with 01/07/2021 sounds to a 58fr without difficultly.  She tolerated the procedure well.    Assessment & Plan:    1. Urethral stricture -dilation  completed today     2. rUTI's -Asymptomatic at this visit.   3. History of hematuria -Hematuria work up completed in 06/2018 - findings positive for NED -No report of gross hematuria -UA today is negative for micro heme    Return in about 6 months (around 07/08/2021) for urethral dilation .  Michiel Cowboy, PA-C  [facility] [address] [place], [place] [address] [phone]

## 2021-01-05 ENCOUNTER — Ambulatory Visit: Payer: Medicare HMO | Admitting: Urology

## 2021-01-05 ENCOUNTER — Other Ambulatory Visit
Admission: RE | Admit: 2021-01-05 | Discharge: 2021-01-05 | Disposition: A | Discharge status: Home / Self Care | Payer: Medicare HMO | Attending: Urology | Admitting: Urology

## 2021-01-05 ENCOUNTER — Other Ambulatory Visit: Payer: Self-pay

## 2021-01-05 ENCOUNTER — Encounter: Payer: Self-pay | Admitting: Urology

## 2021-01-05 ENCOUNTER — Other Ambulatory Visit: Payer: Self-pay | Admitting: *Deleted

## 2021-01-05 VITALS — BP 133/76 | HR 77 | Ht 63.0 in | Wt 139.0 lb

## 2021-01-05 DIAGNOSIS — R319 Hematuria, unspecified: Secondary | ICD-10-CM

## 2021-01-05 DIAGNOSIS — N390 Urinary tract infection, site not specified: Secondary | ICD-10-CM | POA: Diagnosis not present

## 2021-01-05 DIAGNOSIS — N3592 Unspecified urethral stricture, female: Secondary | ICD-10-CM | POA: Insufficient documentation

## 2021-01-05 LAB — URINALYSIS, COMPLETE (UACMP) WITH MICROSCOPIC
Bilirubin Urine: NEGATIVE
Glucose, UA: NEGATIVE mg/dL
Hgb urine dipstick: NEGATIVE
Ketones, ur: NEGATIVE mg/dL
Leukocytes,Ua: NEGATIVE
Nitrite: NEGATIVE
Protein, ur: NEGATIVE mg/dL
Specific Gravity, Urine: 1.015 (ref 1.005–1.030)
pH: 6 (ref 5.0–8.0)

## 2021-01-05 MED ORDER — HYOPHEN 81.6 MG PO TABS: 81.6000 mg | ORAL_TABLET | Freq: Four times a day (QID) | ORAL | 3 refills | Status: DC

## 2021-07-05 NOTE — Progress Notes (Deleted)
3:06 PM   Brenda Hayden 07/11/1950 [id number]  Referring provider: Laurine Blazer, MD [address] [facility] [address] [place],  [place] [address]   No chief complaint on file.    Urological history: 1. High risk hematuria -non-smoker -CTU 2017 NED -cysto 2017 NED -RUS 2020 NED -cysto 2020 NED -no report of gross hematuria -UA ***  2. Urethral stricture -managed with urethral dilation every 6 months and Hyophen   3. rUTI's -contributing factors of age, vaginal atrophy and diabetes -documented positive urine cultures over the last year  None   HPI: Brenda Hayden is a 71 y.o. female who presents today for 6 month follow up.  At her visit on 01/05/2021, she was finding in more difficult to urinate.  She underwent urethral dilation and a refill was given for Hyophen.    UA ***  PVR ***     PMH: Past Medical History:  Diagnosis Date   Asthma    Glaucoma    HLD (hyperlipidemia)    HTN (hypertension)    Incomplete bladder emptying    Recurrent UTI    Urethral stenosis    Urethral stricture    Urgency incontinence     Surgical History: Past Surgical History:  Procedure Laterality Date   ABDOMINAL HYSTERECTOMY     CATARACT EXTRACTION, BILATERAL     GANGLION CYST EXCISION     KNEE SURGERY     NASAL SINUS SURGERY     TONSILLECTOMY      Home Medications:  Allergies as of 07/06/2021       Reactions   Nubain [nalbuphine Hcl]    Amlodipine Other (See Comments)   "Muscle pain and weakness"   Atorvastatin Other (See Comments)   Felt bad, loss of appetite   Ciprofloxacin-ciproflox Hcl Er    Fluticasone Other (See Comments)   Heart palpitations, dizziness    Latex    Losartan Other (See Comments)   Fatigue   Nalbuphine Other (See Comments)   Penicillins Nausea And Vomiting   Singulair  [montelukast Sodium] Other (See Comments)        Medication List        Accurate as of July 05, 2021  3:06 PM. If you have any questions, ask your  nurse or doctor.          beclomethasone 80 MCG/ACT inhaler Commonly known as: QVAR Inhale 2 puffs into the lungs 2 (two) times daily.   candesartan 4 MG tablet Commonly known as: ATACAND Take 4 mg by mouth daily.   hydrochlorothiazide 25 MG tablet Commonly known as: HYDRODIURIL Take 25 mg by mouth daily.   Hyophen 81.6 MG Tabs Generic drug: Meth-Hyo-M Bl-Benz Acd-Ph Sal Take 1 tablet (81.6 mg total) by mouth QID.   pravastatin 40 MG tablet Commonly known as: PRAVACHOL Take 40 mg by mouth daily.   Salmon Oil Caps Take 1 capsule by mouth daily.   vitamin E 1000 UNIT capsule Take 1,000 Units by mouth daily.        Allergies:  Allergies  Allergen Reactions   Nubain [Nalbuphine Hcl]    Amlodipine Other (See Comments)    "Muscle pain and weakness"   Atorvastatin Other (See Comments)    Felt bad, loss of appetite   Ciprofloxacin-Ciproflox Hcl Er    Fluticasone Other (See Comments)    Heart palpitations, dizziness    Latex    Losartan Other (See Comments)    Fatigue   Nalbuphine Other (See Comments)   Penicillins  Nausea And Vomiting   Singulair  [Montelukast Sodium] Other (See Comments)    Family History: Family History  Problem Relation Age of Onset   Colon cancer Mother    Diabetes Father    Kidney disease Neg Hx    Prostate cancer Neg Hx    Bladder Cancer Neg Hx    Kidney cancer Neg Hx     Social History:  reports that she has never smoked. She has never used smokeless tobacco. She reports that she does not drink alcohol and does not use drugs.  ROS: For pertinent review of systems please refer to history of present illness  Physical Exam: There were no vitals taken for this visit.  Constitutional:  Well nourished. Alert and oriented, No acute distress. HEENT: [place] AT, moist mucus membranes.  Trachea midline, no masses. Cardiovascular: No clubbing, cyanosis, or edema. Respiratory: Normal respiratory effort, no increased work of breathing. GI:  Abdomen is soft, non tender, non distended, no abdominal masses. Liver and spleen not palpable.  No hernias appreciated.  Stool sample for occult testing is not indicated.   GU: No CVA tenderness.  No bladder fullness or masses.  *** external genitalia, *** pubic hair distribution, no lesions.  Normal urethral meatus, no lesions, no prolapse, no discharge.   No urethral masses, tenderness and/or tenderness. No bladder fullness, tenderness or masses. *** vagina mucosa, *** estrogen effect, no discharge, no lesions, *** pelvic support, *** cystocele and *** rectocele noted.  No cervical motion tenderness.  Uterus is freely mobile and non-fixed.  No adnexal/parametria masses or tenderness noted.  Anus and perineum are without rashes or lesions.   ***  Skin: No rashes, bruises or suspicious lesions. Lymph: No cervical or inguinal adenopathy. Neurologic: Grossly intact, no focal deficits, moving all 4 extremities. Psychiatric: Normal mood and affect.    Laboratory Data: Urinalysis: *** I have reviewed the labs.  Procedure Patient is placed in stirrups and her urethral meatus and vulva are cleansed with Betadine.  2% Lidocaine jelly was inserted into her urethra.  I then dilated her with Elby Showers sounds to a 91fr without difficultly.  She tolerated the procedure well.    Assessment & Plan:    1. Urethral stricture -dilation completed today     2. rUTI's -Asymptomatic at this visit.   3. History of hematuria -Hematuria work up completed in 06/2018 - findings positive for NED -No report of gross hematuria -UA today is negative for micro heme    No follow-ups on file.  Zara Council, PA-C  [facility] [address] [place], [place] [address] [phone]

## 2021-07-06 ENCOUNTER — Other Ambulatory Visit
Admission: RE | Admit: 2021-07-06 | Discharge: 2021-07-06 | Disposition: A | Discharge status: Home / Self Care | Payer: Medicare HMO | Attending: Urology | Admitting: Urology

## 2021-07-06 ENCOUNTER — Other Ambulatory Visit: Payer: Self-pay

## 2021-07-06 ENCOUNTER — Other Ambulatory Visit: Payer: Self-pay | Admitting: *Deleted

## 2021-07-06 ENCOUNTER — Ambulatory Visit: Payer: Medicare HMO | Admitting: Urology

## 2021-07-06 DIAGNOSIS — N3592 Unspecified urethral stricture, female: Secondary | ICD-10-CM | POA: Insufficient documentation

## 2021-07-06 DIAGNOSIS — R300 Dysuria: Secondary | ICD-10-CM | POA: Insufficient documentation

## 2021-07-06 DIAGNOSIS — N390 Urinary tract infection, site not specified: Secondary | ICD-10-CM

## 2021-07-06 DIAGNOSIS — R319 Hematuria, unspecified: Secondary | ICD-10-CM

## 2021-07-06 LAB — URINALYSIS, COMPLETE (UACMP) WITH MICROSCOPIC
Bilirubin Urine: NEGATIVE
Glucose, UA: NEGATIVE mg/dL
Hgb urine dipstick: NEGATIVE
Ketones, ur: 15 mg/dL — AB
Leukocytes,Ua: NEGATIVE
Nitrite: NEGATIVE
Protein, ur: 30 mg/dL — AB
Specific Gravity, Urine: 1.02 (ref 1.005–1.030)
pH: 7 (ref 5.0–8.0)

## 2021-07-06 NOTE — Addendum Note (Signed)
Addended by: Despina Hidden on: 07/06/2021 10:08 AM   Modules accepted: Orders

## 2021-07-06 NOTE — Addendum Note (Signed)
Addended by: Jodean Lima H on: 07/06/2021 10:10 AM   Modules accepted: Orders

## 2021-07-07 LAB — URINE CULTURE

## 2021-07-08 ENCOUNTER — Other Ambulatory Visit: Payer: Self-pay

## 2021-07-08 ENCOUNTER — Ambulatory Visit: Payer: Medicare HMO

## 2021-07-08 DIAGNOSIS — N3592 Unspecified urethral stricture, female: Secondary | ICD-10-CM

## 2021-07-08 LAB — URINALYSIS, COMPLETE
Bilirubin, UA: NEGATIVE
Glucose, UA: NEGATIVE
Ketones, UA: NEGATIVE
Leukocytes,UA: NEGATIVE
Nitrite, UA: NEGATIVE
Protein,UA: NEGATIVE
RBC, UA: NEGATIVE
Specific Gravity, UA: 1.025 (ref 1.005–1.030)
Urobilinogen, Ur: 0.2 mg/dL (ref 0.2–1.0)
pH, UA: 6 (ref 5.0–7.5)

## 2021-07-08 LAB — MICROSCOPIC EXAMINATION: Bacteria, UA: NONE SEEN

## 2021-07-08 NOTE — Progress Notes (Signed)
In and Out Catheterization  Patient is present today for a I & O catheterization due to rUTI. Patient was cleaned and prepped in a sterile fashion with betadine . A 14FR cath was inserted no complications were noted , of urine return was noted, urine was light blue in color. A clean urine sample was collected for urinalysis and culture. Bladder was drained  And catheter was removed with out difficulty.    Performed by: Franchot Erichsen CMA & Gerarda Gunther RMA

## 2021-07-10 NOTE — Progress Notes (Signed)
9:12 AM   Brenda Hayden [birth year] [id number]  Referring provider: Laurine Blazer, MD [address] [facility] [address] [place],  [place] [address]   Chief Complaint  Patient presents with   Follow-up    Urethral dilation     Urological history: 1. High risk hematuria -non-smoker -CTU 2017 NED -cysto 2017 NED -RUS 2020 NED -cysto 2020 NED -no report of gross hematuria -UA negative for micro heme  2. Urethral stricture -managed with urethral dilation every 6 months and Hyophen   3. rUTI's -contributing factors of age, vaginal atrophy and diabetes -documented positive urine cultures over the last year  None  HPI: Brenda Hayden is a 71 y.o. female who presents today for 6 month follow up.  At her visit on 01/05/2021, she was finding in more difficult to urinate.  She underwent urethral dilation and a refill was given for Hyophen.    She gave an urine specimen last week and it was positive for 6-10 WBCs and 6-10 RBCs and few bacteria.  Urine culture grew out more than 3 organisms.  She returned for a cath UA.  CATH UA negative for micro heme.  Urine culture was negative.    She is having baseline incontinence which is minimal.   PVR 20 mL   She requested a prescription of trimethoprim to have on hand as she states she has episodes of burning.  I inquired for how long that she has been experiencing this burning, she states she has been having it since she was 16.  She notices it mostly when she does not drink water and is likely dehydrated.  The dysuria will be relieved if she increases her water intake.  Patient denies any modifying or aggravating factors.  Patient denies any gross hematuria, dysuria or suprapubic/flank pain.  Patient denies any fevers, chills, nausea or vomiting.    PMH: Past Medical History:  Diagnosis Date   Asthma    Glaucoma    HLD (hyperlipidemia)    HTN (hypertension)    Incomplete bladder emptying    Recurrent UTI    Urethral  stenosis    Urethral stricture    Urgency incontinence     Surgical History: Past Surgical History:  Procedure Laterality Date   ABDOMINAL HYSTERECTOMY     CATARACT EXTRACTION, BILATERAL     GANGLION CYST EXCISION     KNEE SURGERY     NASAL SINUS SURGERY     TONSILLECTOMY      Home Medications:  Allergies as of 07/13/2021       Reactions   Nubain [nalbuphine Hcl]    Amlodipine Other (See Comments)   "Muscle pain and weakness"   Atorvastatin Other (See Comments)   Felt bad, loss of appetite   Ciprofloxacin-ciproflox Hcl Er    Fluticasone Other (See Comments)   Heart palpitations, dizziness    Latex    Losartan Other (See Comments)   Fatigue   Nalbuphine Other (See Comments)   Penicillins Nausea And Vomiting   Singulair  [montelukast Sodium] Other (See Comments)        Medication List        Accurate as of July 13, 2021  9:12 AM. If you have any questions, ask your nurse or doctor.          beclomethasone 80 MCG/ACT inhaler Commonly known as: QVAR Inhale 2 puffs into the lungs 2 (two) times daily.   candesartan 4 MG tablet Commonly known as: ATACAND Take 4 mg  by mouth daily.   hydrochlorothiazide 25 MG tablet Commonly known as: HYDRODIURIL Take 25 mg by mouth daily.   Hyophen 81.6 MG Tabs Generic drug: Meth-Hyo-M Bl-Benz Acd-Ph Sal Take 1 tablet (81.6 mg total) by mouth QID.   pravastatin 40 MG tablet Commonly known as: PRAVACHOL Take 40 mg by mouth daily.   Salmon Oil Caps Take 1 capsule by mouth daily.   vitamin E 1000 UNIT capsule Take 1,000 Units by mouth daily.        Allergies:  Allergies  Allergen Reactions   Nubain [Nalbuphine Hcl]    Amlodipine Other (See Comments)    "Muscle pain and weakness"   Atorvastatin Other (See Comments)    Felt bad, loss of appetite   Ciprofloxacin-Ciproflox Hcl Er    Fluticasone Other (See Comments)    Heart palpitations, dizziness    Latex    Losartan Other (See Comments)    Fatigue    Nalbuphine Other (See Comments)   Penicillins Nausea And Vomiting   Singulair  [Montelukast Sodium] Other (See Comments)    Family History: Family History  Problem Relation Age of Onset   Colon cancer Mother    Diabetes Father    Kidney disease Neg Hx    Prostate cancer Neg Hx    Bladder Cancer Neg Hx    Kidney cancer Neg Hx     Social History:  reports that she has never smoked. She has never used smokeless tobacco. She reports that she does not drink alcohol and does not use drugs.  ROS: For pertinent review of systems please refer to history of present illness  Physical Exam: BP 117/73    Pulse 83    Ht 5\' 2"  (1.575 m)    Wt 139 lb (63 kg)    BMI 25.42 kg/m   Constitutional:  Well nourished. Alert and oriented, No acute distress. HEENT: [place] AT, mask in place.  Trachea midline Cardiovascular: No clubbing, cyanosis, or edema. Respiratory: Normal respiratory effort, no increased work of breathing. GU: No CVA tenderness.  No bladder fullness or masses.  Atrophic external genitalia, sparse pubic hair distribution, no lesions.  Normal urethral meatus, no lesions, no prolapse, no discharge.   Urethral caruncle noted.  No urethral masses, tenderness and/or tenderness. No bladder fullness, tenderness or masses. Pale vagina mucosa, fair estrogen effect, no discharge, no lesions, fair pelvic support, no cystocele and no rectocele noted.  Anus and perineum are without rashes or lesions.     Neurologic: Grossly intact, no focal deficits, moving all 4 extremities. Psychiatric: Normal mood and affect.    Laboratory Data: Urinalysis: Component     Latest Ref Rng & Units 07/08/2021  Specific Gravity, UA     1.005 - 1.030 1.025  pH, UA     5.0 - 7.5 6.0  Color, UA     Yellow Green (A)  Appearance Ur     Clear Clear  Leukocytes,UA     Negative Negative  Protein,UA     Negative/Trace Negative  Glucose, UA     Negative Negative  Ketones, UA     Negative Negative  RBC, UA     Negative  Negative  Bilirubin, UA     Negative Negative  Urobilinogen, Ur     0.2 - 1.0 mg/dL 0.2  Nitrite, UA     Negative Negative  Microscopic Examination      See below:   Component     Latest Ref Rng & Units 07/08/2021  WBC, UA  0 - 5 /hpf 0-5  RBC     0 - 2 /hpf 0-2  Epithelial Cells (non renal)     0 - 10 /hpf 0-10  Casts     None seen /lpf Present (A)  Cast Type     N/A Hyaline casts  Mucus, UA     Not Estab. Present (A)  Bacteria, UA     None seen/Few None seen  I have reviewed the labs.  Procedure Patient is placed in stirrups and her urethral meatus and vulva are cleansed with Betadine.  2% Lidocaine jelly was inserted into her urethra.  I then dilated her with Elby Showers sounds to a 22 without difficultly.  She tolerated the procedure well.  She will return in 6 months.   Assessment & Plan:    1. Urethral stricture -dilation completed today     2. rUTI's -Asymptomatic at this visit. -Discussed with her that dysuria can sometimes be a symptoms of bladder cancer and that I cannot prescribe trimethoprim to treat the dysuria especially when she attributes it to not drinking enough water and it abates when she drinks water and not have any urinalysis/urine culture performed at the time of dysuria to confirm infection -We discussed the next time she experiences dysuria, she is to increase her water and if it persists to contact the office so that may we conduct a urinalysis and urine culture and if necessary repeat cystoscopy or urine cytology  3. High risk hematuria -Hematuria work up completed in 2017 and 06/2018 - findings positive for NED -No report of gross hematuria -CATH UA today is negative for micro heme -Previous clean-catch sample was positive for microhematuria, but this was also after she had undergone prep for colonoscopy and was having diarrhea and wiping constantly causing rectal irritation and vaginal irritation -We will continue to monitor the urine at her  next visit and she will also contact us if she should experience any gross hematuria or dysuria in the interim -If microscopic hematuria persists without infection we will need to repeat hematuria work-up    Return in about 6 months (around 01/10/2022) for UA, symptom recheck and possible urethral dilation .  Zara Council, PA-C  [facility] [address] [place], [place] [address] [phone]

## 2021-07-11 LAB — CULTURE, URINE COMPREHENSIVE

## 2021-07-13 ENCOUNTER — Other Ambulatory Visit: Payer: Self-pay

## 2021-07-13 ENCOUNTER — Ambulatory Visit: Payer: Medicare HMO | Admitting: Urology

## 2021-07-13 ENCOUNTER — Encounter: Payer: Self-pay | Admitting: Urology

## 2021-07-13 VITALS — BP 117/73 | HR 83 | Ht 62.0 in | Wt 139.0 lb

## 2021-07-13 DIAGNOSIS — N3592 Unspecified urethral stricture, female: Secondary | ICD-10-CM

## 2021-07-13 DIAGNOSIS — N390 Urinary tract infection, site not specified: Secondary | ICD-10-CM

## 2021-07-13 DIAGNOSIS — R319 Hematuria, unspecified: Secondary | ICD-10-CM | POA: Diagnosis not present

## 2021-12-07 ENCOUNTER — Other Ambulatory Visit: Payer: Self-pay

## 2021-12-07 ENCOUNTER — Ambulatory Visit: Payer: Medicare HMO | Admitting: Urology

## 2021-12-07 ENCOUNTER — Other Ambulatory Visit
Admission: RE | Admit: 2021-12-07 | Discharge: 2021-12-07 | Disposition: A | Discharge status: Home / Self Care | Payer: Medicare HMO | Attending: Urology | Admitting: Urology

## 2021-12-07 VITALS — BP 133/71 | HR 82 | Ht 63.0 in | Wt 141.8 lb

## 2021-12-07 DIAGNOSIS — N390 Urinary tract infection, site not specified: Secondary | ICD-10-CM | POA: Insufficient documentation

## 2021-12-07 DIAGNOSIS — N3592 Unspecified urethral stricture, female: Secondary | ICD-10-CM | POA: Diagnosis not present

## 2021-12-07 LAB — URINALYSIS, COMPLETE (UACMP) WITH MICROSCOPIC
Bacteria, UA: NONE SEEN
Bilirubin Urine: NEGATIVE
Glucose, UA: NEGATIVE mg/dL
Hgb urine dipstick: NEGATIVE
Ketones, ur: NEGATIVE mg/dL
Leukocytes,Ua: NEGATIVE
Nitrite: NEGATIVE
Protein, ur: NEGATIVE mg/dL
RBC / HPF: NONE SEEN RBC/hpf (ref 0–5)
Specific Gravity, Urine: 1.015 (ref 1.005–1.030)
pH: 6.5 (ref 5.0–8.0)

## 2022-01-04 ENCOUNTER — Ambulatory Visit: Payer: Medicare HMO | Admitting: Urology

## 2022-05-28 ENCOUNTER — Other Ambulatory Visit: Payer: Self-pay

## 2022-05-28 DIAGNOSIS — N3592 Unspecified urethral stricture, female: Secondary | ICD-10-CM

## 2022-05-28 DIAGNOSIS — N390 Urinary tract infection, site not specified: Secondary | ICD-10-CM

## 2022-05-30 NOTE — Progress Notes (Unsigned)
05/31/22 4:11 PM   Brenda Hayden 1950/10/05 [id number]  Referring provider:  Laurine Blazer, MD [phone] [facility]. [address] [place],  [place] [address]   Urological history  1. High risk hematuria -non-smoker -CTU 2017 NED -cysto 2017 NED -RUS 2020 NED -cysto 2020 NED -no report of gross hematuria -UA negative for micro heme   2. Urethral stricture -managed with urethral dilation every 6 months and Hyophen    3. rUTI's -contributing factors of age, vaginal atrophy and diabetes -documented urine cultures over the last year             07/08/2021 No growth    HPI: Brenda Hayden is a 71 y.o.female who presents today for a 6 month follow-up.   She is having urinary issues and requesting dilation today.   She continues to have rare instances of suprapubic discomfort that last for only a few minutes.  She states she has had them all her life.    Patient denies any modifying or aggravating factors.  Patient denies any gross hematuria, dysuria or suprapubic/flank pain. Patient denies any fevers, chills, nausea or vomiting.   UA yellow clear, specific gravity 1.010, pH 6.5, 0-5 squamous epithelial and rare bacteria.    PMH: Past Medical History:  Diagnosis Date   Asthma    Glaucoma    HLD (hyperlipidemia)    HTN (hypertension)    Incomplete bladder emptying    Recurrent UTI    Urethral stenosis    Urethral stricture    Urgency incontinence    Surgical History: Past Surgical History:  Procedure Laterality Date   ABDOMINAL HYSTERECTOMY     CATARACT EXTRACTION, BILATERAL     GANGLION CYST EXCISION     KNEE SURGERY     NASAL SINUS SURGERY     TONSILLECTOMY     Home Medications:  Allergies as of 05/31/2022       Reactions   Nubain [nalbuphine Hcl]    Amlodipine Other (See Comments)   "Muscle pain and weakness"   Atorvastatin Other (See Comments)   Felt bad, loss of appetite   Ciprofloxacin-ciproflox Hcl Er    Fluticasone Other (See Comments)   Heart  palpitations, dizziness    Latex    Losartan Other (See Comments)   Fatigue   Nalbuphine Other (See Comments)   Penicillins Nausea And Vomiting   Singulair  [montelukast Sodium] Other (See Comments)        Medication List        Accurate as of May 31, 2022  4:11 PM. If you have any questions, ask your nurse or doctor.          ascorbic acid 500 MG tablet Commonly known as: VITAMIN C Take by mouth daily.   beclomethasone 80 MCG/ACT inhaler Commonly known as: QVAR Inhale 2 puffs into the lungs 2 (two) times daily.   hydrochlorothiazide 25 MG tablet Commonly known as: HYDRODIURIL Take 25 mg by mouth daily.   Hyophen 81.6 MG Tabs Generic drug: Meth-Hyo-M Bl-Benz Acd-Ph Sal Take 1 tablet (81.6 mg total) by mouth QID.   pravastatin 40 MG tablet Commonly known as: PRAVACHOL Take 40 mg by mouth daily.   PreviDent 5000 Booster Plus 1.1 % Pste Generic drug: Sodium Fluoride Place onto teeth daily.   Salmon Oil Caps Take 1 capsule by mouth daily.   sulfamethoxazole-trimethoprim 800-160 MG tablet Commonly known as: BACTRIM DS Take 1 tablet by mouth every 12 (twelve) hours. Started by: Michiel Cowboy, PA-C   timolol  0.5 % ophthalmic solution Commonly known as: TIMOPTIC Place 1 drop into the left eye 2 (two) times daily.   vitamin E 1000 UNIT capsule Take 1,000 Units by mouth daily.        Allergies:  Allergies  Allergen Reactions   Nubain [Nalbuphine Hcl]    Amlodipine Other (See Comments)    "Muscle pain and weakness"   Atorvastatin Other (See Comments)    Felt bad, loss of appetite   Ciprofloxacin-Ciproflox Hcl Er    Fluticasone Other (See Comments)    Heart palpitations, dizziness    Latex    Losartan Other (See Comments)    Fatigue   Nalbuphine Other (See Comments)   Penicillins Nausea And Vomiting   Singulair  [Montelukast Sodium] Other (See Comments)    Family History: Family History  Problem Relation Age of Onset   Colon cancer  Mother    Diabetes Father    Kidney disease Neg Hx    Prostate cancer Neg Hx    Bladder Cancer Neg Hx    Kidney cancer Neg Hx     Social History:  reports that she has never smoked. She has never used smokeless tobacco. She reports that she does not drink alcohol and does not use drugs.   Physical Exam: BP (!) 140/72 (BP Location: Left Arm, Patient Position: Sitting, Cuff Size: Normal)   Pulse 64   Ht 5\' 3"  (1.6 m)   Wt 138 lb (62.6 kg)   BMI 24.45 kg/m   Constitutional:  Well nourished. Alert and oriented, No acute distress. HEENT: [place] AT, moist mucus membranes.  Trachea midline Cardiovascular: No clubbing, cyanosis, or edema. Respiratory: Normal respiratory effort, no increased work of breathing. GU: No CVA tenderness.  No bladder fullness or masses. Vulvovaginal atrophy w/ pallor.    Anus and perineum are without rashes or lesions.    Neurologic: Grossly intact, no focal deficits, moving all 4 extremities. Psychiatric: Normal mood and affect.    Laboratory Data: Hemoglobin A1c (11/2021) 5.6  Urinalysis:  See EPIC and HPI I have reviewed the labs.   Procedure Patient is placed in stirrups and her urethral meatus and vulva are cleansed with Betadine.  2% Lidocaine jelly was inserted into her urethra.  I then dilated her with 12/2021 sounds to a 30 without difficultly.  She tolerated the procedure well.  She will return in 6 months  Assessment & Plan:   High risk hematuria - Hematuria work up completed in 2017 and 06/2018 - findings positive for NED - No report of gross hematuria - CATH UA today is negative for micro heme  2. Urethral stricture -dilation completed today      Return in about 6 months (around 11/30/2022) for dilation .  12/02/2022  [facility] [address], [address] [place], [place] [phone]

## 2022-05-31 ENCOUNTER — Encounter: Payer: Self-pay | Admitting: Urology

## 2022-05-31 ENCOUNTER — Other Ambulatory Visit
Admission: RE | Admit: 2022-05-31 | Discharge: 2022-05-31 | Disposition: A | Discharge status: Home / Self Care | Payer: Medicare HMO | Attending: Urology | Admitting: Urology

## 2022-05-31 ENCOUNTER — Ambulatory Visit: Payer: Medicare HMO | Admitting: Urology

## 2022-05-31 VITALS — BP 140/72 | HR 64 | Ht 63.0 in | Wt 138.0 lb

## 2022-05-31 DIAGNOSIS — N3592 Unspecified urethral stricture, female: Secondary | ICD-10-CM | POA: Insufficient documentation

## 2022-05-31 DIAGNOSIS — R319 Hematuria, unspecified: Secondary | ICD-10-CM

## 2022-05-31 DIAGNOSIS — N390 Urinary tract infection, site not specified: Secondary | ICD-10-CM | POA: Insufficient documentation

## 2022-05-31 LAB — URINALYSIS, COMPLETE (UACMP) WITH MICROSCOPIC
Bilirubin Urine: NEGATIVE
Glucose, UA: NEGATIVE mg/dL
Hgb urine dipstick: NEGATIVE
Ketones, ur: NEGATIVE mg/dL
Leukocytes,Ua: NEGATIVE
Nitrite: NEGATIVE
Protein, ur: NEGATIVE mg/dL
RBC / HPF: NONE SEEN RBC/hpf (ref 0–5)
Specific Gravity, Urine: 1.01 (ref 1.005–1.030)
WBC, UA: NONE SEEN WBC/hpf (ref 0–5)
pH: 6.5 (ref 5.0–8.0)

## 2022-05-31 MED ORDER — LIDOCAINE HCL URETHRAL/MUCOSAL 2 % EX GEL
1.0000 | Freq: Once | CUTANEOUS | Status: AC
  Administered 2022-05-31: 1 via URETHRAL

## 2022-05-31 MED ORDER — SULFAMETHOXAZOLE-TRIMETHOPRIM 800-160 MG PO TABS: 1.0000 | ORAL_TABLET | Freq: Two times a day (BID) | ORAL | 0 refills | Status: DC

## 2022-11-29 ENCOUNTER — Ambulatory Visit: Payer: Medicare HMO | Admitting: Urology

## 2023-04-06 NOTE — Progress Notes (Signed)
04/11/23 10:05 AM   Brenda Hayden [birth year] [id number]  Referring provider:  Laurine Blazer, MD [phone] [facility]. [address] [place],  [place] [address]  Urological history  1. High risk hematuria -non-smoker -CTU 2017 NED -cysto 2017 NED -RUS 2020 NED -cysto 2020 NED  2. Urethral stricture -managed with urethral dilation every 6 months and Hyophen    3. rUTI's -contributing factors of age, vaginal atrophy and diabetes -documented urine cultures over the last year             None  HPI: Brenda Hayden is a 72 y.o.female who presents today for a 6 month follow-up.   Previous records reviewed.   She is having issues with her insurance covering the Hyophen and she needs a refill on the Bactrim.  Patient denies any modifying or aggravating factors.  Patient denies any recent UTI's, gross hematuria, dysuria or suprapubic/flank pain.  Patient denies any fevers, chills, nausea or vomiting.    UA unremarkable    PMH: Past Medical History:  Diagnosis Date   Asthma    Glaucoma    HLD (hyperlipidemia)    HTN (hypertension)    Incomplete bladder emptying    Recurrent UTI    Urethral stenosis    Urethral stricture    Urgency incontinence    Surgical History: Past Surgical History:  Procedure Laterality Date   ABDOMINAL HYSTERECTOMY     CATARACT EXTRACTION, BILATERAL     GANGLION CYST EXCISION     KNEE SURGERY     NASAL SINUS SURGERY     TONSILLECTOMY     Home Medications:  Allergies as of 04/11/2023       Reactions   Nubain [nalbuphine Hcl]    Amlodipine Other (See Comments)   "Muscle pain and weakness"   Atorvastatin Other (See Comments)   Felt bad, loss of appetite   Ciprofloxacin-ciproflox Hcl Er    Fluticasone Other (See Comments)   Heart palpitations, dizziness    Latex    Losartan Other (See Comments)   Fatigue   Nalbuphine Other (See Comments)   Penicillins Nausea And Vomiting   Singulair  [montelukast Sodium] Other (See Comments)         Medication List        Accurate as of April 11, 2023 10:05 AM. If you have any questions, ask your nurse or doctor.          ascorbic acid 500 MG tablet Commonly known as: VITAMIN C Take by mouth daily.   beclomethasone 80 MCG/ACT inhaler Commonly known as: QVAR Inhale 2 puffs into the lungs 2 (two) times daily.   hydrochlorothiazide 25 MG tablet Commonly known as: HYDRODIURIL Take 25 mg by mouth daily.   Hyophen 81.6 MG Tabs Generic drug: Meth-Hyo-M Bl-Benz Acd-Ph Sal Take 1 tablet (81.6 mg total) by mouth QID.   pravastatin 40 MG tablet Commonly known as: PRAVACHOL Take 40 mg by mouth daily.   PreviDent 5000 Booster Plus 1.1 % Pste Generic drug: Sodium Fluoride Place onto teeth daily.   Salmon Oil Caps Take 1 capsule by mouth daily.   sulfamethoxazole-trimethoprim 800-160 MG tablet Commonly known as: BACTRIM DS Take 1 tablet by mouth every 12 (twelve) hours.   timolol 0.5 % ophthalmic solution Commonly known as: TIMOPTIC Place 1 drop into the left eye 2 (two) times daily.   vitamin E 1000 UNIT capsule Take 1,000 Units by mouth daily.        Allergies:  Allergies  Allergen Reactions  Nubain [Nalbuphine Hcl]    Amlodipine Other (See Comments)    "Muscle pain and weakness"   Atorvastatin Other (See Comments)    Felt bad, loss of appetite   Ciprofloxacin-Ciproflox Hcl Er    Fluticasone Other (See Comments)    Heart palpitations, dizziness    Latex    Losartan Other (See Comments)    Fatigue   Nalbuphine Other (See Comments)   Penicillins Nausea And Vomiting   Singulair  [Montelukast Sodium] Other (See Comments)    Family History: Family History  Problem Relation Age of Onset   Colon cancer Mother    Diabetes Father    Kidney disease Neg Hx    Prostate cancer Neg Hx    Bladder Cancer Neg Hx    Kidney cancer Neg Hx     Social History:  reports that she has never smoked. She has never used smokeless tobacco. She reports that she does  not drink alcohol and does not use drugs.   Physical Exam: BP 107/69   Pulse 75   Ht 5\' 3"  (1.6 m)   Wt 134 lb (60.8 kg)   BMI 23.74 kg/m   Constitutional:  Well nourished. Alert and oriented, No acute distress. HEENT: [place] AT, moist mucus membranes.  Trachea midline Cardiovascular: No clubbing, cyanosis, or edema. Respiratory: Normal respiratory effort, no increased work of breathing. GU: No CVA tenderness.  No bladder fullness or masses.  Recession of labia minora, dry, pale vulvar vaginal mucosa and loss of mucosal ridges and folds.  Normal urethral meatus, no lesions, no prolapse, no discharge.   No urethral masses, tenderness and/or tenderness.  Neurologic: Grossly intact, no focal deficits, moving all 4 extremities. Psychiatric: Normal mood and affect.    Laboratory Data: Urinalysis:  See EPIC and HPI I have reviewed the labs.   Procedure Patient is placed in stirrups and her urethral meatus and vulva are cleansed with Betadine.  2% Lidocaine jelly was inserted into her urethra.  I then dilated her with Leta Jungling sounds to a 28 without difficultly.  She tolerated the procedure well.  She will return in 6 months  Assessment & Plan:    High risk hematuria -non-smoker - Hematuria work up completed in 2017 and 06/2018 - findings positive for NED - No report of gross hematuria - UA today is negative for micro heme  2. Urethral stricture -dilation completed today    -Need refill on the Bactrim to take prophylactically after dilation and it is given -Needed prescription for the Hyophen that is given as well   Return in about 6 months (around 10/10/2023).  Cloretta Ned   [facility] [address], [address] [place], [place] [address] [phone]

## 2023-04-07 ENCOUNTER — Other Ambulatory Visit: Payer: Self-pay | Admitting: Urology

## 2023-04-07 DIAGNOSIS — R319 Hematuria, unspecified: Secondary | ICD-10-CM

## 2023-04-11 ENCOUNTER — Encounter: Payer: Self-pay | Admitting: Urology

## 2023-04-11 ENCOUNTER — Telehealth: Payer: Self-pay

## 2023-04-11 ENCOUNTER — Ambulatory Visit: Payer: Medicare HMO | Admitting: Urology

## 2023-04-11 ENCOUNTER — Other Ambulatory Visit
Admission: RE | Admit: 2023-04-11 | Discharge: 2023-04-11 | Disposition: A | Discharge status: Home / Self Care | Payer: Medicare HMO | Attending: Urology | Admitting: Urology

## 2023-04-11 VITALS — BP 107/69 | HR 75 | Ht 63.0 in | Wt 134.0 lb

## 2023-04-11 DIAGNOSIS — N3592 Unspecified urethral stricture, female: Secondary | ICD-10-CM | POA: Diagnosis not present

## 2023-04-11 DIAGNOSIS — R319 Hematuria, unspecified: Secondary | ICD-10-CM | POA: Insufficient documentation

## 2023-04-11 DIAGNOSIS — N390 Urinary tract infection, site not specified: Secondary | ICD-10-CM

## 2023-04-11 LAB — URINALYSIS, COMPLETE (UACMP) WITH MICROSCOPIC
Bilirubin Urine: NEGATIVE
Glucose, UA: NEGATIVE mg/dL
Hgb urine dipstick: NEGATIVE
Ketones, ur: NEGATIVE mg/dL
Leukocytes,Ua: NEGATIVE
Nitrite: NEGATIVE
Protein, ur: NEGATIVE mg/dL
Specific Gravity, Urine: 1.02 (ref 1.005–1.030)
pH: 7 (ref 5.0–8.0)

## 2023-04-11 MED ORDER — HYOPHEN 81.6 MG PO TABS: 81.6000 mg | ORAL_TABLET | Freq: Four times a day (QID) | ORAL | 3 refills | Status: DC

## 2023-04-11 MED ORDER — SULFAMETHOXAZOLE-TRIMETHOPRIM 800-160 MG PO TABS: 1.0000 | ORAL_TABLET | Freq: Two times a day (BID) | ORAL | 0 refills | Status: DC

## 2023-04-11 NOTE — Telephone Encounter (Signed)
Patient was seen today in the office and mentioned that she spoke to her [employer] rep De Blanch about her insurance and her plan and her medication. Minerva Areola mentioned to the patient that we could do an exception for her medication Meth-Hyo-M Bl-Benz Acd-Ph Sal (HYOPHEN) 81.6 MG TABS to see if it can help with the cost for her. I advised patient we would need the form and information to whom it needs to be faxed to. Patient gave me Eric's number [phone] to ask him, I called and left him a message asking for the form to be faxed to [place]. I looked online to try and locate this form but different versions of forms came up. Not sure which will be correct. Asked eric to call us back if he had any other information. Carollee Herter is aware of this request from the patient.

## 2023-04-28 ENCOUNTER — Other Ambulatory Visit: Payer: Self-pay | Admitting: Urology

## 2023-04-28 DIAGNOSIS — N3592 Unspecified urethral stricture, female: Secondary | ICD-10-CM

## 2023-04-28 MED ORDER — URIBEL 118 MG PO CAPS: 1.0000 | ORAL_CAPSULE | Freq: Four times a day (QID) | ORAL | 3 refills | Status: DC | PRN

## 2023-04-28 NOTE — Telephone Encounter (Addendum)
Per CMM, covered formulary alternatives are:  Hyoscyamine Sulfate Uribel  Will send for prior auth for Hyophen 81.6 mg tablet    Brenda Hayden (Key: [id number]) Rx #: [id number] DENIED:  Information regarding your request The medication you have requested is not covered by Medicare Part D Law. If you believe the medication is being used for a medically accepted or compendia supported indication approved by CMS, please contact your patient's plan.

## 2023-05-16 ENCOUNTER — Other Ambulatory Visit: Payer: Self-pay

## 2023-05-16 DIAGNOSIS — N390 Urinary tract infection, site not specified: Secondary | ICD-10-CM

## 2023-05-16 MED ORDER — HYOPHEN 81.6 MG PO TABS: 81.6000 mg | ORAL_TABLET | Freq: Four times a day (QID) | ORAL | 3 refills | Status: DC

## 2023-05-16 NOTE — Telephone Encounter (Signed)
Patient left message asking for refill on Hyophen medication

## 2023-05-16 NOTE — Telephone Encounter (Signed)
Spoke with patient and she cash pay for this medication and no prior auth nor tier exception was needed. She knew it wasn't covered. Per pt she will not take the bactrim or Hyophen together.

## 2023-10-02 NOTE — Progress Notes (Unsigned)
 10/03/23 2:34 PM   Brenda Hayden 1951-05-24 [id number]  Referring provider:  Mel Spine, MD [phone] [facility]. [address] [place],  [place] [address]  Urological history  1. High risk hematuria -non-smoker -CTU 2017 NED -cysto 2017 NED -RUS 2020 NED -cysto 2020 NED  2. Urethral stricture -managed with urethral dilation every 6 months and Hyophen    3. rUTI's  HPI: Brenda Hayden is a 73 y.o. woman who presents today for a 6 month follow-up.   Previous records reviewed.   She has been having more frequency and urgency lately.  Patient denies any modifying or aggravating factors.  Patient denies any recent UTI's, gross hematuria, dysuria or suprapubic/flank pain.  Patient denies any fevers, chills, nausea or vomiting.    UA yellow clear, specific gravity 1.015, pH 6.5, 0-5 squames, non squamous epithelial present, 0-5 WBCs, 0-5 RBCs and few bacteria  PMH: Past Medical History:  Diagnosis Date   Asthma    Glaucoma    HLD (hyperlipidemia)    HTN (hypertension)    Incomplete bladder emptying    Recurrent UTI    Urethral stenosis    Urethral stricture    Urgency incontinence    Surgical History: Past Surgical History:  Procedure Laterality Date   ABDOMINAL HYSTERECTOMY     CATARACT EXTRACTION, BILATERAL     GANGLION CYST EXCISION     KNEE SURGERY     NASAL SINUS SURGERY     TONSILLECTOMY     Home Medications:  Allergies as of 10/03/2023       Reactions   Nubain [nalbuphine Hcl]    Amlodipine Other (See Comments)   "Muscle pain and weakness"   Atorvastatin Other (See Comments)   Felt bad, loss of appetite   Ciprofloxacin-ciproflox Hcl Er    Fluticasone Other (See Comments)   Heart palpitations, dizziness    Latex    Losartan Other (See Comments)   Fatigue   Nalbuphine Other (See Comments)   Penicillins Nausea And Vomiting   Singulair  [montelukast Sodium] Other (See Comments)        Medication List        Accurate as of October 03, 2023  2:34 PM. If you have any questions, ask your nurse or doctor.          STOP taking these medications    Hyophen 81.6 MG Tabs Generic drug: Meth-Hyo-M Bl-Benz Acd-Ph Sal Stopped by: Sunnie Odden   sulfamethoxazole -trimethoprim  800-160 MG tablet Commonly known as: BACTRIM  DS Stopped by: Cathleen Coach Taylan Marez       TAKE these medications    ascorbic acid 500 MG tablet Commonly known as: VITAMIN C Take by mouth daily.   beclomethasone 80 MCG/ACT inhaler Commonly known as: QVAR Inhale 2 puffs into the lungs 2 (two) times daily.   hydrochlorothiazide 25 MG tablet Commonly known as: HYDRODIURIL Take 25 mg by mouth daily.   pravastatin 40 MG tablet Commonly known as: PRAVACHOL Take 40 mg by mouth daily.   PreviDent 5000 Booster Plus 1.1 % Pste Generic drug: Sodium Fluoride Place onto teeth daily.   Salmon Oil Caps Take 1 capsule by mouth daily.   Uribel  118 MG Caps Take 1 capsule (118 mg total) by mouth every 6 (six) hours as needed.   vitamin E 1000 UNIT capsule Take 1,000 Units by mouth daily.        Allergies:  Allergies  Allergen Reactions   Nubain [Nalbuphine Hcl]    Amlodipine Other (See Comments)    "  Muscle pain and weakness"   Atorvastatin Other (See Comments)    Felt bad, loss of appetite   Ciprofloxacin-Ciproflox Hcl Er    Fluticasone Other (See Comments)    Heart palpitations, dizziness    Latex    Losartan Other (See Comments)    Fatigue   Nalbuphine Other (See Comments)   Penicillins Nausea And Vomiting   Singulair  [Montelukast Sodium] Other (See Comments)    Family History: Family History  Problem Relation Age of Onset   Colon cancer Mother    Diabetes Father    Kidney disease Neg Hx    Prostate cancer Neg Hx    Bladder Cancer Neg Hx    Kidney cancer Neg Hx     Social History:  reports that she has never smoked. She has never used smokeless tobacco. She reports that she does not drink alcohol and does not use  drugs.   Physical Exam: BP 127/77   Pulse 77   Ht 5\' 3"  (1.6 m)   Wt 137 lb 4 oz (62.3 kg)   BMI 24.31 kg/m   Constitutional:  Well nourished. Alert and oriented, No acute distress. HEENT: [place] AT, moist mucus membranes.  Trachea midline Cardiovascular: No clubbing, cyanosis, or edema. Respiratory: Normal respiratory effort, no increased work of breathing. GU: No CVA tenderness.  No bladder fullness or masses.  Recession of labia minora, dry, pale vulvar vaginal mucosa and loss of mucosal ridges and folds.  Normal urethral meatus, no lesions, no prolapse, no discharge.    Neurologic: Grossly intact, no focal deficits, moving all 4 extremities. Psychiatric: Normal mood and affect.    Laboratory Data: Urinalysis:  See EPIC and HPI I have reviewed the labs.   Procedure Patient is placed in stirrups and her urethral meatus and vulva are cleansed with Betadine.  2% Lidocaine  jelly was inserted into her urethra.  I then dilated her with Maryrose Soja sounds to a 24 without difficultly.  She tolerated the procedure well.    Assessment & Plan:    High risk hematuria -non-smoker - Hematuria work up completed in 2017 and 06/2018 - findings positive for NED - No report of gross hematuria - UA today is negative for micro heme  2. Urethral stricture -dilation completed today    -Bactrim  to take prophylactically after dilation  -refill for the Hyophen that is given as well   No follow-ups on file.  Briant Camper   [facility] [address], [address] [place], [place] [address] [phone]

## 2023-10-03 ENCOUNTER — Encounter: Payer: Self-pay | Admitting: Urology

## 2023-10-03 ENCOUNTER — Other Ambulatory Visit
Admission: RE | Admit: 2023-10-03 | Discharge: 2023-10-03 | Disposition: A | Discharge status: Home / Self Care | Attending: Urology | Admitting: Urology

## 2023-10-03 ENCOUNTER — Ambulatory Visit: Payer: Self-pay | Admitting: Urology

## 2023-10-03 ENCOUNTER — Other Ambulatory Visit: Payer: Self-pay

## 2023-10-03 VITALS — BP 127/77 | HR 77 | Ht 63.0 in | Wt 137.2 lb

## 2023-10-03 DIAGNOSIS — N3592 Unspecified urethral stricture, female: Secondary | ICD-10-CM | POA: Insufficient documentation

## 2023-10-03 DIAGNOSIS — R319 Hematuria, unspecified: Secondary | ICD-10-CM | POA: Diagnosis not present

## 2023-10-03 LAB — URINALYSIS, COMPLETE (UACMP) WITH MICROSCOPIC
Bilirubin Urine: NEGATIVE
Glucose, UA: NEGATIVE mg/dL
Hgb urine dipstick: NEGATIVE
Ketones, ur: NEGATIVE mg/dL
Leukocytes,Ua: NEGATIVE
Nitrite: NEGATIVE
Protein, ur: NEGATIVE mg/dL
Specific Gravity, Urine: 1.015 (ref 1.005–1.030)
pH: 6.5 (ref 5.0–8.0)

## 2023-10-03 MED ORDER — SULFAMETHOXAZOLE-TRIMETHOPRIM 800-160 MG PO TABS: 1.0000 | ORAL_TABLET | Freq: Two times a day (BID) | ORAL | 0 refills | Status: DC

## 2023-10-03 MED ORDER — URIBEL 118 MG PO CAPS: 1.0000 | ORAL_CAPSULE | Freq: Four times a day (QID) | ORAL | 3 refills | Status: DC | PRN

## 2023-10-10 ENCOUNTER — Ambulatory Visit: Payer: Medicare HMO | Admitting: Urology

## 2024-01-05 NOTE — Progress Notes (Unsigned)
 01/05/24 2:58 PM   [name] April 16, 1951 [id number]  Referring provider:  Lang Dragon, MD [phone] [address] [facility]. [address] [place],  [place] [address]  Urological history  1. High risk hematuria -non-smoker -CTU 2017 NED -cysto 2017 NED -RUS 2020 NED -cysto 2020 NED  2. Urethral stricture -managed with urethral dilation every 6 months and Hyophen    3. rUTI's  HPI: Brenda Hayden is a 73 y.o. woman who presents today for a 6 month follow-up.   Previous records reviewed.    UA ***  PMH: Past Medical History:  Diagnosis Date   Asthma    Glaucoma    HLD (hyperlipidemia)    HTN (hypertension)    Incomplete bladder emptying    Recurrent UTI    Urethral stenosis    Urethral stricture    Urgency incontinence    Surgical History: Past Surgical History:  Procedure Laterality Date   ABDOMINAL HYSTERECTOMY     CATARACT EXTRACTION, BILATERAL     GANGLION CYST EXCISION     KNEE SURGERY     NASAL SINUS SURGERY     TONSILLECTOMY     Home Medications:  Allergies as of 01/16/2024       Reactions   Nubain [nalbuphine Hcl]    Amlodipine Other (See Comments)   Muscle pain and weakness   Atorvastatin Other (See Comments)   Felt bad, loss of appetite   Ciprofloxacin-ciproflox Hcl Er    Fluticasone Other (See Comments)   Heart palpitations, dizziness    Latex    Losartan Other (See Comments)   Fatigue   Nalbuphine Other (See Comments)   Penicillins Nausea And Vomiting   Singulair  [montelukast Sodium] Other (See Comments)        Medication List        Accurate as of January 05, 2024  2:58 PM. If you have any questions, ask your nurse or doctor.          ascorbic acid 500 MG tablet Commonly known as: VITAMIN C Take by mouth daily.   beclomethasone 80 MCG/ACT inhaler Commonly known as: QVAR Inhale 2 puffs into the lungs 2 (two) times daily.   hydrochlorothiazide 25 MG tablet Commonly known as: HYDRODIURIL Take 25 mg by mouth daily.    pravastatin 40 MG tablet Commonly known as: PRAVACHOL Take 40 mg by mouth daily.   PreviDent 5000 Booster Plus 1.1 % Pste Generic drug: Sodium Fluoride Place onto teeth daily.   Salmon Oil Caps Take 1 capsule by mouth daily.   sulfamethoxazole -trimethoprim  800-160 MG tablet Commonly known as: BACTRIM  DS Take 1 tablet by mouth every 12 (twelve) hours.   Uribel  118 MG Caps Take 1 capsule (118 mg total) by mouth every 6 (six) hours as needed.   vitamin E 1000 UNIT capsule Take 1,000 Units by mouth daily.        Allergies:  Allergies  Allergen Reactions   Nubain [Nalbuphine Hcl]    Amlodipine Other (See Comments)    Muscle pain and weakness   Atorvastatin Other (See Comments)    Felt bad, loss of appetite   Ciprofloxacin-Ciproflox Hcl Er    Fluticasone Other (See Comments)    Heart palpitations, dizziness    Latex    Losartan Other (See Comments)    Fatigue   Nalbuphine Other (See Comments)   Penicillins Nausea And Vomiting   Singulair  [Montelukast Sodium] Other (See Comments)    Family History: Family History  Problem Relation Age of Onset  Colon cancer Mother    Diabetes Father    Kidney disease Neg Hx    Prostate cancer Neg Hx    Bladder Cancer Neg Hx    Kidney cancer Neg Hx     Social History:  reports that she has never smoked. She has never used smokeless tobacco. She reports that she does not drink alcohol and does not use drugs.   Physical Exam: There were no vitals taken for this visit.  Constitutional:  Well nourished. Alert and oriented, No acute distress. HEENT: [place] AT, moist mucus membranes.  Trachea midline, no masses. Cardiovascular: No clubbing, cyanosis, or edema. Respiratory: Normal respiratory effort, no increased work of breathing. GU: No CVA tenderness.  No bladder fullness or masses.  Recession of labia minora, dry, pale vulvar vaginal mucosa and loss of mucosal ridges and folds.  Normal urethral meatus, no lesions, no prolapse,  no discharge.   No urethral masses, tenderness and/or tenderness. No bladder fullness, tenderness or masses. *** vagina mucosa, *** estrogen effect, no discharge, no lesions, *** pelvic support, *** cystocele and *** rectocele noted.  No cervical motion tenderness.  Uterus is freely mobile and non-fixed.  No adnexal/parametria masses or tenderness noted.  Anus and perineum are without rashes or lesions.   ***  Neurologic: Grossly intact, no focal deficits, moving all 4 extremities. Psychiatric: Normal mood and affect.    Laboratory Data: See EPIC and HPI I have reviewed the labs.   Procedure Patient is placed in stirrups and her urethral meatus and vulva are cleansed with Betadine.  2% Lidocaine  jelly was inserted into her urethra.  I then dilated her with [place] sounds to a 24 without difficultly.  She tolerated the procedure well.    Assessment & Plan:    High risk hematuria - non-smoker - Hematuria work up completed in 2017 and 06/2018 - findings positive for NED -    2. Urethral stricture -    No follow-ups on file.  Brenda Hayden   [facility] [address] [place], [place] [address] [phone]

## 2024-01-16 ENCOUNTER — Other Ambulatory Visit: Payer: Self-pay

## 2024-01-16 ENCOUNTER — Ambulatory Visit: Admitting: Urology

## 2024-01-16 ENCOUNTER — Encounter: Payer: Self-pay | Admitting: Urology

## 2024-01-16 ENCOUNTER — Other Ambulatory Visit
Admission: RE | Admit: 2024-01-16 | Discharge: 2024-01-16 | Disposition: A | Discharge status: Home / Self Care | Attending: Urology | Admitting: Urology

## 2024-01-16 VITALS — BP 130/73 | HR 68 | Ht 63.0 in | Wt 134.8 lb

## 2024-01-16 DIAGNOSIS — R3989 Other symptoms and signs involving the genitourinary system: Secondary | ICD-10-CM

## 2024-01-16 DIAGNOSIS — N3592 Unspecified urethral stricture, female: Secondary | ICD-10-CM

## 2024-01-16 DIAGNOSIS — R319 Hematuria, unspecified: Secondary | ICD-10-CM | POA: Diagnosis not present

## 2024-01-16 LAB — URINALYSIS, COMPLETE (UACMP) WITH MICROSCOPIC
Bilirubin Urine: NEGATIVE
Glucose, UA: NEGATIVE mg/dL
Hgb urine dipstick: NEGATIVE
Ketones, ur: NEGATIVE mg/dL
Nitrite: NEGATIVE
Protein, ur: NEGATIVE mg/dL
Specific Gravity, Urine: 1.025 (ref 1.005–1.030)
pH: 6.5 (ref 5.0–8.0)

## 2024-01-16 MED ORDER — CEFUROXIME AXETIL 250 MG PO TABS: 250.0000 mg | ORAL_TABLET | Freq: Two times a day (BID) | ORAL | 0 refills | Status: AC

## 2024-01-18 ENCOUNTER — Ambulatory Visit: Payer: Self-pay | Admitting: Urology

## 2024-01-18 LAB — URINE CULTURE: Culture: NO GROWTH

## 2024-03-25 ENCOUNTER — Other Ambulatory Visit: Payer: Self-pay | Admitting: Urology

## 2024-03-25 DIAGNOSIS — N3592 Unspecified urethral stricture, female: Secondary | ICD-10-CM

## 2024-03-27 ENCOUNTER — Telehealth: Payer: Self-pay

## 2024-03-27 NOTE — Telephone Encounter (Signed)
 Started Prior Authorization on cover my meds

## 2024-04-02 ENCOUNTER — Ambulatory Visit: Admitting: Urology

## 2024-05-01 ENCOUNTER — Encounter: Payer: Self-pay | Admitting: Urology

## 2024-05-19 NOTE — Progress Notes (Unsigned)
 05/21/24 3:18 PM   Brenda Hayden 01/03/1951 [id number]  Referring provider:  Lang Dragon, MD [phone] [facility] [address] [place],  [place] [address]  Urological history  1. High risk hematuria -non-smoker -CTU 2017 NED -cysto 2017 NED -RUS 2020 NED -cysto 2020 NED  2. Urethral stricture -managed with urethral dilation every 6 months and Hyophen    3. rUTI's  HPI: Brenda Hayden is a 73 y.o. woman who presents today for a dilation.  Previous records reviewed.   UA Green clear, specific gravity 1.020, pH 6.0, 0-5 WBCs, 0-2 RBCs and 0-10 epithelial cells.  PMH: Past Medical History:  Diagnosis Date   Asthma    Glaucoma    HLD (hyperlipidemia)    HTN (hypertension)    Incomplete bladder emptying    Recurrent UTI    Urethral stenosis    Urethral stricture    Urgency incontinence    Surgical History: Past Surgical History:  Procedure Laterality Date   ABDOMINAL HYSTERECTOMY     CATARACT EXTRACTION, BILATERAL     GANGLION CYST EXCISION     KNEE SURGERY     NASAL SINUS SURGERY     TONSILLECTOMY     Home Medications:  Allergies as of 05/21/2024       Reactions   Nubain [nalbuphine Hcl]    Amlodipine Other (See Comments)   Muscle pain and weakness   Atorvastatin Other (See Comments)   Felt bad, loss of appetite   Ciprofloxacin-ciproflox Hcl Er    Fluticasone Other (See Comments)   Heart palpitations, dizziness    Latex    Losartan Other (See Comments)   Fatigue   Nalbuphine Other (See Comments)   Penicillins Nausea And Vomiting   Singulair  [montelukast Sodium] Other (See Comments)        Medication List        Accurate as of May 21, 2024  3:18 PM. If you have any questions, ask your nurse or doctor.          ascorbic acid 500 MG tablet Commonly known as: VITAMIN C Take by mouth daily.   beclomethasone 80 MCG/ACT inhaler Commonly known as: QVAR Inhale 2 puffs into the lungs 2 (two) times daily.   hydrochlorothiazide  25 MG tablet Commonly known as: HYDRODIURIL Take 25 mg by mouth daily.   pravastatin 40 MG tablet Commonly known as: PRAVACHOL Take 40 mg by mouth daily.   Salmon Oil Caps Take 1 capsule by mouth daily.   sulfamethoxazole -trimethoprim  800-160 MG tablet Commonly known as: BACTRIM  DS Take 1 tablet by mouth every 12 (twelve) hours.   timolol 0.5 % ophthalmic solution Commonly known as: TIMOPTIC Place 1 drop into the left eye 2 (two) times daily.   Uro-MP  118 MG Caps Take 1 capsule (118 mg total) by mouth every 6 (six) hours as needed. TAKE 1 CAPSULE BY MOUTH EVERY 6 HOURS AS NEEDED What changed: See the new instructions.   vitamin E 1000 UNIT capsule Take 1,000 Units by mouth daily.        Allergies:  Allergies  Allergen Reactions   Nubain [Nalbuphine Hcl]    Amlodipine Other (See Comments)    Muscle pain and weakness   Atorvastatin Other (See Comments)    Felt bad, loss of appetite   Ciprofloxacin-Ciproflox Hcl Er    Fluticasone Other (See Comments)    Heart palpitations, dizziness    Latex    Losartan Other (See Comments)    Fatigue   Nalbuphine  Other (See Comments)   Penicillins Nausea And Vomiting   Singulair  [Montelukast Sodium] Other (See Comments)    Family History: Family History  Problem Relation Age of Onset   Colon cancer Mother    Diabetes Father    Kidney disease Neg Hx    Prostate cancer Neg Hx    Bladder Cancer Neg Hx    Kidney cancer Neg Hx     Social History:  reports that she has never smoked. She has never used smokeless tobacco. She reports that she does not drink alcohol and does not use drugs.   Physical Exam: BP 138/79 (BP Location: Left Arm, Patient Position: Sitting, Cuff Size: Normal)   Pulse 76   Wt 132 lb (59.9 kg)   SpO2 96%   BMI 23.38 kg/m   Constitutional:  Well nourished. Alert and oriented, No acute distress. HEENT: [place] AT, moist mucus membranes.  Trachea midline Cardiovascular: No clubbing, cyanosis, or  edema. Respiratory: Normal respiratory effort, no increased work of breathing. Neurologic: Grossly intact, no focal deficits, moving all 4 extremities. Psychiatric: Normal mood and affect.    Laboratory Data: See EPIC and HPI I have reviewed the labs.   Patient is placed in stirrups and her urethral meatus and vulva are cleansed with Betadine.  2% Lidocaine  jelly was inserted into her urethra.  I then dilated her with Corbett sounds to a 28 without difficultly.  She tolerated the procedure well.  She will return in 6 months.   Assessment & Plan:    High risk hematuria - non-smoker - Hematuria work up completed in 2017 and 06/2018 - findings positive for NED - UA negative for hematuria  2. Urethral stricture - dilation completed    Return in about 6 months (around 11/19/2024) for dilation .  [name]   [facility] [address], [address] [place], [place] [address] [phone]

## 2024-05-21 ENCOUNTER — Ambulatory Visit: Admitting: Urology

## 2024-05-21 VITALS — BP 138/79 | HR 76 | Wt 132.0 lb

## 2024-05-21 DIAGNOSIS — N3592 Unspecified urethral stricture, female: Secondary | ICD-10-CM | POA: Diagnosis not present

## 2024-05-21 MED ORDER — URO-MP 118 MG PO CAPS: 118.0000 mg | ORAL_CAPSULE | Freq: Four times a day (QID) | ORAL | 0 refills | Status: AC | PRN

## 2024-05-21 MED ORDER — SULFAMETHOXAZOLE-TRIMETHOPRIM 800-160 MG PO TABS: 1.0000 | ORAL_TABLET | Freq: Two times a day (BID) | ORAL | 0 refills | Status: AC

## 2024-05-22 LAB — URINALYSIS, COMPLETE
Bilirubin, UA: NEGATIVE
Glucose, UA: NEGATIVE
Ketones, UA: NEGATIVE
Leukocytes,UA: NEGATIVE
Nitrite, UA: NEGATIVE
Protein,UA: NEGATIVE
RBC, UA: NEGATIVE
Specific Gravity, UA: 1.02 (ref 1.005–1.030)
Urobilinogen, Ur: 0.2 mg/dL (ref 0.2–1.0)
pH, UA: 6 (ref 5.0–7.5)

## 2024-05-22 LAB — MICROSCOPIC EXAMINATION: Bacteria, UA: NONE SEEN

## 2024-06-25 NOTE — ED Notes (Signed)
 [facility] Emergency Department Resident Attestation   Diagnosis ICD-10-CM Associated Orders  1. Vertigo  R42       Brenda Hayden is a 74 y.o. patient with a history of asthma, HTN, and HLD who presents with vertigo that started when she woke up this morning. She also reports one episode of emesis and one episode of diarrhea earlier today. No fever, ha, CP, palpitations, focal weakness, numbness, incontinence. No recent head injury.   On exam, the patient appears in no acute distress. VS are within normal limits. NAD, lungs CTAB, RRR, no m/g/r, no bruits, abd s/nt/nd, no pulsatile mass, neuro exam grossly intact, neck supple.  Differential includes benign paroxysmal positional vertigo, vestibular neuritis, labyrinthitis, medication side effect, dehydration. Also consider stroke, ICH or other CNS cause, but less likely based on hx, exam.   Plan for labs. Will give meclizine. Consider valium. If no relief with medications, consider brain imaging.   Current Medications[1]  I supervised care provided by the resident. We have discussed the case, I have reviewed the note, and I agree with the plan of treatment.  I personally interviewed the patient and examined the patient.         I have personally reviewed the EKG.  I agree with the resident's interpretation of the EKG.   Discussion of Management with other Physicians, QHP or Appropriate Source:  N/A External Records Reviewed: Patient's most recent outpatient clinic note 06/20/2024 [facility] Office Visit for patient's past medical history  Escalation of Care, Consideration of Admission/Observation/Transfer:  N/A Social determinants that significantly affected care: None applicable Prescription drug(s) considered but not prescribed: None indicated Diagnostic tests considered but not performed: CT head, MRI Brain/MRA neck considered but not indicated as pt's symptoms resolved. History obtained from  other sources: Husband  Documentation assistance was provided by Recardo Bile, Scribe, on June 25, 2024 at 8:58 PM for Loa Pinal, MD.  June 25, 2024 8:57 PM. Documentation assistance provided by the scribe. I was present during the time the encounter was recorded. The information recorded by the scribe was done at my direction and has been reviewed and validated by me.         [1] No current facility-administered medications for this encounter.   Current Outpatient Medications  Medication Sig Dispense Refill   aspirin 81 mg Tab Take 1 tablet by mouth daily.     pravastatin (PRAVACHOL) 40 MG tablet Take 1 tablet (40 mg total) by mouth daily.     sulfamethoxazole -trimethoprim  (BACTRIM  DS) 800-160 mg per tablet Take 1 tablet (160 mg of trimethoprim  total) by mouth every twelve (12) hours.     timolol (TIMOPTIC) 0.5 % ophthalmic solution Administer 1 drop into the left eye two (2) times a day.     vitamin E acetate (VITAMIN E-670 MG, 1000 UNIT,) 670 mg (1000 UNIT) capsule Take 1 capsule (670 mg total) by mouth.

## 2024-11-06 ENCOUNTER — Ambulatory Visit: Admitting: Urology
